# Patient Record
Sex: Male | Born: 1985
Health system: Southern US, Community
[De-identification: ages and names within clinical notes are randomized; demographics above are authoritative.]

## PROBLEM LIST (undated history)

## (undated) DIAGNOSIS — K219 Gastro-esophageal reflux disease without esophagitis: Secondary | ICD-10-CM

## (undated) DIAGNOSIS — F32A Depression, unspecified: Secondary | ICD-10-CM

## (undated) HISTORY — PX: WISDOM TOOTH EXTRACTION: SHX21

## (undated) HISTORY — DX: Depression, unspecified: F32.A

## (undated) HISTORY — DX: Gastro-esophageal reflux disease without esophagitis: K21.9

---
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
## 2007-01-06 ENCOUNTER — Ambulatory Visit: Payer: Self-pay | Admitting: Family Medicine

## 2007-03-25 ENCOUNTER — Ambulatory Visit: Payer: Self-pay | Admitting: Family Medicine

## 2007-03-25 DIAGNOSIS — K0501 Acute gingivitis, non-plaque induced: Secondary | ICD-10-CM

## 2007-03-27 LAB — CONVERTED CEMR LAB
ALT: 65 units/L — ABNORMAL HIGH (ref 0–53)
AST: 38 units/L — ABNORMAL HIGH (ref 0–37)
Albumin: 4.7 g/dL (ref 3.5–5.2)
Basophils Relative: 0.3 % (ref 0.0–1.0)
Chloride: 101 meq/L (ref 96–112)
Eosinophils Absolute: 0.6 10*3/uL (ref 0.0–0.6)
Glucose, Bld: 74 mg/dL (ref 70–99)
Hemoglobin: 18.1 g/dL — ABNORMAL HIGH (ref 13.0–17.0)
Monocytes Absolute: 0.8 10*3/uL — ABNORMAL HIGH (ref 0.2–0.7)
Potassium: 3.6 meq/L (ref 3.5–5.1)
RDW: 11.8 % (ref 11.5–14.6)
Sodium: 146 meq/L — ABNORMAL HIGH (ref 135–145)
Total Bilirubin: 0.5 mg/dL (ref 0.3–1.2)
Total Protein: 7.6 g/dL (ref 6.0–8.3)
VLDL: 16 mg/dL (ref 0–40)
WBC: 9.8 10*3/uL (ref 4.5–10.5)

## 2008-05-07 ENCOUNTER — Ambulatory Visit: Payer: Self-pay | Admitting: Family Medicine

## 2008-08-30 ENCOUNTER — Ambulatory Visit: Payer: Self-pay | Admitting: Family Medicine

## 2008-08-30 DIAGNOSIS — H00019 Hordeolum externum unspecified eye, unspecified eyelid: Secondary | ICD-10-CM

## 2009-02-24 ENCOUNTER — Ambulatory Visit: Payer: Self-pay | Admitting: Family Medicine

## 2009-02-24 DIAGNOSIS — J309 Allergic rhinitis, unspecified: Secondary | ICD-10-CM | POA: Insufficient documentation

## 2009-06-05 ENCOUNTER — Ambulatory Visit: Payer: Self-pay | Admitting: Family Medicine

## 2009-06-05 DIAGNOSIS — R42 Dizziness and giddiness: Secondary | ICD-10-CM

## 2009-06-12 ENCOUNTER — Ambulatory Visit: Payer: Self-pay | Admitting: Family Medicine

## 2009-06-12 DIAGNOSIS — F329 Major depressive disorder, single episode, unspecified: Secondary | ICD-10-CM | POA: Insufficient documentation

## 2009-07-26 ENCOUNTER — Ambulatory Visit: Payer: Self-pay | Admitting: Family Medicine

## 2009-07-26 DIAGNOSIS — J019 Acute sinusitis, unspecified: Secondary | ICD-10-CM | POA: Insufficient documentation

## 2011-01-18 NOTE — Assessment & Plan Note (Signed)
[facility] OFFICE NOTE   NAME:Wayne Patel, Wayne Patel                      MRN:          [id number]  DATE:01/06/2007                            DOB:          02/23/1986    This is a 25 year old gentleman here for a college entrance examination  and generally feels fine and has no complaints at all.  He plans to  begin classes at [employer] this coming August and he  plans to study communications.  He brings with him immunization records  which show that he is up to date, although he is past due for a tetanus  booster.   For details of his past medical history, family history, social history,  habits, etc. I will refer you to our introductory note with him dated  March 07, 1999.   ALLERGIES:  None   MEDICATIONS:  None   OBJECTIVE:  Height 5 foot 4 inches, weight 202, BP 122/72, pulse 76 and  regular.  GENERAL:  He is a little overweight.  SKIN:  Is clear.  EYES:  Are clear. Vision uncorrected is 20/10 on the right, 20/10 on the  left and 20/13 bilaterally. Hearing is grossly normal.  Ears are clear, oropharynx clear.  NECK:  Supple without lymphadenopathy or masses.  LUNGS:  Are clear.  CARDIAC:  Rate and rhythm regular without gallops, murmurs or rubs.  Distal pulses are full.  ABDOMEN:  Soft, normal bowel sounds, nontender, no masses.  GENITALIA:  Normal male.  He is circumcised.  EXTREMITIES:  No clubbing, cyanosis, or edema.  NEUROLOGIC:  Exam is grossly intact.   ASSESSMENT AND PLAN:  Complete physical.  He was given a Tdap booster  today, all of his forms were filled out and he can followup with [place] as  needed.     Tera Mater. Clent Ridges, MD  Electronically Signed    SAF/MedQ  DD: 01/06/2007  DT: 01/06/2007  Job #: [id number]

## 2013-04-22 ENCOUNTER — Telehealth: Payer: Self-pay | Admitting: Family Medicine

## 2013-04-22 NOTE — Telephone Encounter (Signed)
Pt has not been seen in over 3 yrs, mom says you the only MD he's ever known.  Would like to know if you would accept back as a pt.

## 2013-04-23 ENCOUNTER — Encounter: Payer: Self-pay | Admitting: Family Medicine

## 2013-04-23 ENCOUNTER — Ambulatory Visit (INDEPENDENT_AMBULATORY_CARE_PROVIDER_SITE_OTHER): Payer: BC Managed Care – PPO | Admitting: Family Medicine

## 2013-04-23 ENCOUNTER — Ambulatory Visit: Payer: Self-pay | Admitting: Family Medicine

## 2013-04-23 VITALS — BP 110/80 | Temp 98.2°F | Wt 210.0 lb

## 2013-04-23 DIAGNOSIS — S2020XA Contusion of thorax, unspecified, initial encounter: Secondary | ICD-10-CM

## 2013-04-23 DIAGNOSIS — S300XXA Contusion of lower back and pelvis, initial encounter: Secondary | ICD-10-CM

## 2013-04-23 NOTE — Telephone Encounter (Signed)
I had sent a note but I see he is already scheduled.
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
## 2014-11-28 ENCOUNTER — Encounter: Payer: Self-pay | Admitting: Family Medicine

## 2014-11-28 ENCOUNTER — Ambulatory Visit (INDEPENDENT_AMBULATORY_CARE_PROVIDER_SITE_OTHER): Payer: BLUE CROSS/BLUE SHIELD | Admitting: Family Medicine

## 2014-11-28 VITALS — BP 108/82 | HR 76 | Temp 98.3°F | Ht 65.0 in | Wt 216.4 lb

## 2014-11-28 DIAGNOSIS — J029 Acute pharyngitis, unspecified: Secondary | ICD-10-CM | POA: Diagnosis not present

## 2014-11-28 LAB — POCT RAPID STREP A (OFFICE): Rapid Strep A Screen: NEGATIVE

## 2014-11-28 NOTE — Progress Notes (Signed)
   HPI:  Sore Throat: -started: 3 days ago -symptoms:sore throat, cough -denies:fever, SOB, NVD, tooth pain -has tried: none -sick contacts/travel/risks: denies flu exposure, tick exposure or or Ebola risks, no strep exposure to his knowledge -Hx of: strep throat and he is concerned for this  ROS: See pertinent positives and negatives per HPI.  No past medical history on file.  No past surgical history on file.  No family history on file.  History   Social History  . Marital Status: Single    Spouse Name: N/A  . Number of Children: N/A  . Years of Education: N/A   Social History Main Topics  . Smoking status: Never Smoker   . Smokeless tobacco: Not on file  . Alcohol Use: Yes     Comment: occ  . Drug Use: Not on file  . Sexual Activity: Not on file   Other Topics Concern  . None   Social History Narrative    No current outpatient prescriptions on file.  EXAM:  Filed Vitals:   11/28/14 1322  BP: 108/82  Pulse: 76  Temp: 98.3 F (36.8 C)    Body mass index is 36.01 kg/(m^2).  GENERAL: vitals reviewed and listed above, alert, oriented, appears well hydrated and in no acute distress  HEENT: atraumatic, conjunttiva clear, no obvious abnormalities on inspection of external nose and ears, normal appearance of ear canals and TMs, clear nasal congestion, mild post oropharyngeal erythema with PND, 2+ tonsillar edema w/ exudate, no sinus TTP  NECK: no obvious masses on inspection  LUNGS: clear to auscultation bilaterally, no wheezes, rales or rhonchi, good air movement  CV: HRRR, no peripheral edema  MS: moves all extremities without noticeable abnormality  PSYCH: pleasant and cooperative, no obvious depression or anxiety  ASSESSMENT AND PLAN:  Discussed the following assessment and plan:  Acute pharyngitis, unspecified pharyngitis type  -We discussed potential etiologies, with strep or viral pharyngitis or mono being most likely -opted for strep test  given findings with culture and treat if strep -We discussed treatment side effects, likely course, antibiotic misuse, transmission, and signs of developing a serious illness. -of course, we advised to return or notify a doctor immediately if symptoms worsen or persist or new concerns arise.    There are no Patient Instructions on file for this visit.   Kriste BasqueKIM, Oleg Oleson R.

## 2014-11-28 NOTE — Progress Notes (Signed)
Pre visit review using our clinic review tool, if applicable. No additional management support is needed unless otherwise documented below in the visit note. 

## 2014-11-28 NOTE — Patient Instructions (Signed)
BEFORE YOU LEAVE: -rapid strep and culture  Pharyngitis Pharyngitis is redness, pain, and swelling (inflammation) of your pharynx.  CAUSES  Pharyngitis is usually caused by infection. Most of the time, these infections are from viruses (viral) and are part of a cold. However, sometimes pharyngitis is caused by bacteria (bacterial). Pharyngitis can also be caused by allergies. Viral pharyngitis may be spread from person to person by coughing, sneezing, and personal items or utensils (cups, forks, spoons, toothbrushes). Bacterial pharyngitis may be spread from person to person by more intimate contact, such as kissing.  SIGNS AND SYMPTOMS  Symptoms of pharyngitis include:   Sore throat.   Tiredness (fatigue).   Low-grade fever.   Headache.  Joint pain and muscle aches.  Skin rashes.  Swollen lymph nodes.  Plaque-like film on throat or tonsils (often seen with bacterial pharyngitis). DIAGNOSIS  Your health care provider will ask you questions about your illness and your symptoms. Your medical history, along with a physical exam, is often all that is needed to diagnose pharyngitis. Sometimes, a rapid strep test is done. Other lab tests may also be done, depending on the suspected cause.  TREATMENT  Viral pharyngitis will usually get better in 3-4 days without the use of medicine. Bacterial pharyngitis is treated with medicines that kill germs (antibiotics).  HOME CARE INSTRUCTIONS   Drink enough water and fluids to keep your urine clear or pale yellow.   Only take over-the-counter or prescription medicines as directed by your health care provider:   If you are prescribed antibiotics, make sure you finish them even if you start to feel better.   Do not take aspirin.   Get lots of rest.   Gargle with 8 oz of salt water ( tsp of salt per 1 qt of water) as often as every 1-2 hours to soothe your throat.   Throat lozenges (if you are not at risk for choking) or sprays may  be used to soothe your throat. SEEK MEDICAL CARE IF:   You have large, tender lumps in your neck.  You have a rash.  You cough up green, yellow-brown, or bloody spit. SEEK IMMEDIATE MEDICAL CARE IF:   Your neck becomes stiff.  You drool or are unable to swallow liquids.  You vomit or are unable to keep medicines or liquids down.  You have severe pain that does not go away with the use of recommended medicines.  You have trouble breathing (not caused by a stuffy nose). MAKE SURE YOU:   Understand these instructions.  Will watch your condition.  Will get help right away if you are not doing well or get worse. Document Released: 08/19/2005 Document Revised: 06/09/2013 Document Reviewed: 04/26/2013 [facility] Patient Information 2015 [place] [employer], [place]. This information is not intended to replace advice given to you by your health care provider. Make sure you discuss any questions you have with your health care provider.

## 2014-11-29 ENCOUNTER — Telehealth: Payer: Self-pay | Admitting: Family Medicine

## 2014-11-29 NOTE — Telephone Encounter (Signed)
Patient informed and he states he will take Tylenol also if needed and denies any breathing problems, throat closing off or difficulty swallowing.  I also advised him to try the salt water gargles more than once a day as he states he only did this once in the am today.

## 2014-11-29 NOTE — Telephone Encounter (Signed)
Please call pt. No strep on the initially test and there is no treatment for viral or mono related pharyngitis in terms of a cure. Salt water gargles and tylenol or ibuprofen for sore throat. Should seek emergency care if feels throat is close off, can not swallow or breathing difficulty.

## 2014-11-29 NOTE — Telephone Encounter (Signed)
Mom called for pt b/c he has called her several times. [place] so he cannot come in office . But his throat is getting worse . Very sore and pt is concerned it is swelling.  Would like to know if rx could be called in or what to do. Asked mom for pt's mobile no and she gave to me. [phone] [facility] ,[address]/[place], [place]

## 2014-11-30 ENCOUNTER — Ambulatory Visit (INDEPENDENT_AMBULATORY_CARE_PROVIDER_SITE_OTHER): Payer: BLUE CROSS/BLUE SHIELD | Admitting: Internal Medicine

## 2014-11-30 ENCOUNTER — Encounter: Payer: Self-pay | Admitting: Internal Medicine

## 2014-11-30 ENCOUNTER — Telehealth: Payer: Self-pay | Admitting: Family Medicine

## 2014-11-30 VITALS — BP 110/96 | HR 92 | Temp 98.3°F | Ht 65.0 in | Wt 214.8 lb

## 2014-11-30 DIAGNOSIS — J039 Acute tonsillitis, unspecified: Secondary | ICD-10-CM | POA: Diagnosis not present

## 2014-11-30 LAB — CULTURE, GROUP A STREP: Organism ID, Bacteria: NORMAL

## 2014-11-30 MED ORDER — PREDNISONE 20 MG PO TABS: 20.0000 mg | ORAL_TABLET | Freq: Two times a day (BID) | ORAL | Status: DC

## 2014-11-30 MED ORDER — CEPHALEXIN 500 MG PO CAPS: 500.0000 mg | ORAL_CAPSULE | Freq: Two times a day (BID) | ORAL | Status: DC

## 2014-11-30 NOTE — Patient Instructions (Signed)
Zicam Melts or Zinc lozenges as per package label for sore throat .    Stay on clear liquids for 48-72 hours or until throat improves.This would include  jello, sherbert (NOT ice cream), Lipton's chicken noodle soup(NOT cream based soups),Gatorade Lite, flat Ginger ale (without High Fructose Corn Syrup),dry toast or crackers, baked potato.No milk , dairy or grease .

## 2014-11-30 NOTE — Progress Notes (Signed)
Pre visit review using our clinic review tool, if applicable. No additional management support is needed unless otherwise documented below in the visit note. 

## 2014-11-30 NOTE — Progress Notes (Signed)
   Subjective:    Patient ID: Wayne Patel, male    DOB: 12/22/1985, 29 y.o.   MRN: [id number]  HPI  His symptoms started 11/25/14 as sore throat and swelling on the right. As of 3/28 this involves the left throat as well. He was seen 3/28; strep swab was negative. He was told to use saline gargles and Tylenol.  The throat symptoms have progressed. He has a cough which he feels is simply a reflex in an attempt to clear his throat. He has no other extrinsic or upper respiratory tract infection symptoms.  Review of Systems Frontal headache, facial pain , nasal purulence, dental pain, sore throat , otic pain or otic discharge denied. No fever , chills or sweats.Extrinsic symptoms of itchy, watery eyes, sneezing, or angioedema are denied. There is no significant cough, sputum production, wheezing,or  paroxysmal nocturnal dyspnea.    Objective:   Physical Exam  General appearance:Adequately nourished; no acute distress or increased work of breathing is present.   BMI:  Lymphatic: No  lymphadenopathy about the head, neck, or axilla .  Eyes: No conjunctival inflammation or lid edema is present. There is no scleral icterus.EOMI w/o pain  Ears:  External ear exam shows no significant lesions or deformities.  Otoscopic examination reveals clear canals, tympanic membranes are intact bilaterally without bulging, retraction, inflammation or discharge.  Nose:  External nasal examination shows no deformity or inflammation. Nasal mucosa minimally erythematous without lesions or exudates No septal dislocation or deviation.No obstruction to airflow.   Oral exam: Dental hygiene is good; lips and gums are healthy appearing.His tonsils are 2.5+ enlarged There is severe erythema with punctate exudate .  Neck:  No deformities, thyromegaly, masses, or tenderness noted.   Supple with full range of motion without pain.   Heart:  Normal rate and regular rhythm. S1 and S2 normal without gallop, murmur,  click, rub or other extra sounds.   Lungs:Chest clear to auscultation; no wheezes, rhonchi,rales ,or rubs present.  Extremities:  No cyanosis, edema, or clubbing  noted    Skin: Warm & dry w/o tenting or jaundice. No significant lesions or rash.       Assessment & Plan:   #1 tonsillitis  Plan: See orders and recommendations

## 2014-12-05 NOTE — Telephone Encounter (Signed)
done

## 2015-05-01 ENCOUNTER — Ambulatory Visit (INDEPENDENT_AMBULATORY_CARE_PROVIDER_SITE_OTHER): Payer: BLUE CROSS/BLUE SHIELD | Admitting: Family Medicine

## 2015-05-01 ENCOUNTER — Encounter: Payer: Self-pay | Admitting: Family Medicine

## 2015-05-01 VITALS — BP 110/80 | HR 78 | Temp 98.2°F | Ht 65.0 in | Wt 211.0 lb

## 2015-05-01 DIAGNOSIS — J039 Acute tonsillitis, unspecified: Secondary | ICD-10-CM | POA: Diagnosis not present

## 2015-05-01 MED ORDER — CEPHALEXIN 500 MG PO CAPS: 500.0000 mg | ORAL_CAPSULE | Freq: Three times a day (TID) | ORAL | Status: DC

## 2015-05-01 NOTE — Progress Notes (Signed)
Pre visit review using our clinic review tool, if applicable. No additional management support is needed unless otherwise documented below in the visit note. 

## 2015-05-01 NOTE — Progress Notes (Signed)
   Subjective:    Patient ID: Wayne Patel, male    DOB: 20-May-1986, 29 y.o.   MRN: [id number]  HPI Here for swollen tonsils. He was seen for a similar bout in March when he had swollen and painfu;l tonsils. A throat culture then was negative. He responded to a course of Keflex and prednisone. He felt back to normal until a week ago when he again developed swollen tonsils that are mildly painful. No other URI sx, no fever.   Review of Systems  Constitutional: Negative.   HENT: Positive for sore throat. Negative for congestion, postnasal drip, sinus pressure, trouble swallowing and voice change.   Eyes: Negative.   Respiratory: Negative.        Objective:   Physical Exam  Constitutional: He appears well-developed and well-nourished.  HENT:  Right Ear: External ear normal.  Left Ear: External ear normal.  Nose: Nose normal.  Both tonsils are red and swollen, no exudate   Eyes: Conjunctivae are normal.  Neck: No thyromegaly present.  Pulmonary/Chest: Effort normal and breath sounds normal.  Lymphadenopathy:    He has no cervical adenopathy.          Assessment & Plan:  Recurrent tonsillitis, treat with Keflex. Use Advil prn.

## 2018-10-04 DIAGNOSIS — B349 Viral infection, unspecified: Secondary | ICD-10-CM | POA: Diagnosis not present

## 2020-01-08 ENCOUNTER — Emergency Department
Admission: EM | Admit: 2020-01-08 | Discharge: 2020-01-08 | Disposition: A | Discharge status: Home / Self Care | Payer: BC Managed Care – PPO | Attending: Emergency Medicine | Admitting: Emergency Medicine

## 2020-01-08 ENCOUNTER — Other Ambulatory Visit: Payer: Self-pay

## 2020-01-08 ENCOUNTER — Encounter: Payer: Self-pay | Admitting: Emergency Medicine

## 2020-01-08 DIAGNOSIS — R0789 Other chest pain: Secondary | ICD-10-CM | POA: Insufficient documentation

## 2020-01-08 DIAGNOSIS — Z5321 Procedure and treatment not carried out due to patient leaving prior to being seen by health care provider: Secondary | ICD-10-CM | POA: Diagnosis not present

## 2020-01-08 LAB — CBC
HCT: 53.6 % — ABNORMAL HIGH (ref 39.0–52.0)
Hemoglobin: 18.3 g/dL — ABNORMAL HIGH (ref 13.0–17.0)
MCH: 29.6 pg (ref 26.0–34.0)
MCHC: 34.1 g/dL (ref 30.0–36.0)
MCV: 86.6 fL (ref 80.0–100.0)
Platelets: 290 10*3/uL (ref 150–400)
RBC: 6.19 MIL/uL — ABNORMAL HIGH (ref 4.22–5.81)
RDW: 12.5 % (ref 11.5–15.5)
WBC: 12.3 10*3/uL — ABNORMAL HIGH (ref 4.0–10.5)
nRBC: 0 % (ref 0.0–0.2)

## 2020-01-08 LAB — BASIC METABOLIC PANEL
Anion gap: 9 (ref 5–15)
BUN: 12 mg/dL (ref 6–20)
CO2: 25 mmol/L (ref 22–32)
Calcium: 9.6 mg/dL (ref 8.9–10.3)
Chloride: 107 mmol/L (ref 98–111)
Creatinine, Ser: 1.01 mg/dL (ref 0.61–1.24)
GFR calc Af Amer: >60 mL/min (ref >60)
GFR calc non Af Amer: >60 mL/min (ref >60)
Glucose, Bld: 100 mg/dL — ABNORMAL HIGH (ref 70–99)
Potassium: 3.8 mmol/L (ref 3.5–5.1)
Sodium: 141 mmol/L (ref 135–145)

## 2020-01-08 LAB — TROPONIN I (HIGH SENSITIVITY): Troponin I (High Sensitivity): 3 ng/L (ref <18)

## 2020-01-08 MED ORDER — SODIUM CHLORIDE 0.9% FLUSH: 3.0000 mL | Freq: Once | INTRAVENOUS | Status: DC

## 2020-01-08 NOTE — ED Notes (Signed)
First Nurse Note: Pt to ED via POV c/o chest pain. Pt is in NAD.  

## 2020-01-08 NOTE — ED Triage Notes (Signed)
Pt presents to ED via POV with c/o generalized CP that started yesterday. Pt states initially started with "feeling off for the last few days". Pt states yesterday started having chest pressure, states feels like it radiates from L to R side. Pt states family hx of early MI. Pt A&O x4, ambulatory to triage at this time.

## 2020-01-10 ENCOUNTER — Telehealth: Payer: Self-pay | Admitting: Emergency Medicine

## 2020-01-10 ENCOUNTER — Telehealth: Payer: Self-pay | Admitting: Family Medicine

## 2020-01-10 NOTE — Telephone Encounter (Signed)
Pt experience pressure on chest this weekend and would like to speak to a nurse. Per pt, he's okay today but has a little of anxiety. Thanks

## 2020-01-10 NOTE — Telephone Encounter (Signed)
error 

## 2020-01-10 NOTE — Telephone Encounter (Signed)
Called patient due to lwot to inquire about condition and follow up plans. Says he has called his pcp and will follow up.

## 2020-01-11 ENCOUNTER — Other Ambulatory Visit: Payer: Self-pay

## 2020-01-11 NOTE — Telephone Encounter (Signed)
Pt will need an appointment to discuss this with Dr. Clent Ridges.  Marcello Fennel

## 2020-01-12 ENCOUNTER — Encounter: Payer: Self-pay | Admitting: Family Medicine

## 2020-01-12 ENCOUNTER — Ambulatory Visit (INDEPENDENT_AMBULATORY_CARE_PROVIDER_SITE_OTHER): Payer: BC Managed Care – PPO | Admitting: Family Medicine

## 2020-01-12 VITALS — BP 132/72 | HR 83 | Temp 98.3°F | Wt 240.4 lb

## 2020-01-12 DIAGNOSIS — R079 Chest pain, unspecified: Secondary | ICD-10-CM

## 2020-01-12 NOTE — Progress Notes (Signed)
   Subjective:    Patient ID: Wayne Patel, male    DOB: 06-Jan-1986, 34 y.o.   MRN: [id number]  HPI Here to re-establish and to follow up an ER visit on 01-08-20. That day he devloped a pressure sensation in the chest that made him worry about his heart. No SOB or nasuea or sweats. He has heartburn occasionally but he had never felt this pressure before. At the ER he left before a provider could see him, but he had labs drawn and an EKG done while he was there. The labs, including a troponin, were all normal and the EKG showed only sinus tachycardia. After he went home the chest pressure resolved and he has felt fine ever since. He admits to gaining a lot of weight in the past year. His diet is terrible and he has been drinking a lot of soda.   Review of Systems  Constitutional: Negative.   Respiratory: Negative.   Cardiovascular: Positive for chest pain. Negative for palpitations and leg swelling.  Gastrointestinal: Negative.        Objective:   Physical Exam Constitutional:      Appearance: He is obese. He is not ill-appearing.  Cardiovascular:     Rate and Rhythm: Normal rate and regular rhythm.     Pulses: Normal pulses.     Heart sounds: Normal heart sounds.  Pulmonary:     Effort: Pulmonary effort is normal.     Breath sounds: Normal breath sounds.  Neurological:     Mental Status: He is alert.           Assessment & Plan:  His episode of chest pressure was almost certainly esophageal in origin. We talked about changing his diet and losing weight. Recheck prn. Gershon Crane, MD

## 2020-07-25 ENCOUNTER — Telehealth (INDEPENDENT_AMBULATORY_CARE_PROVIDER_SITE_OTHER): Payer: Self-pay | Admitting: Family Medicine

## 2020-07-25 ENCOUNTER — Encounter: Payer: Self-pay | Admitting: Family Medicine

## 2020-07-25 VITALS — Temp 97.6°F | Wt 240.0 lb

## 2020-07-25 DIAGNOSIS — J0391 Acute recurrent tonsillitis, unspecified: Secondary | ICD-10-CM

## 2020-07-25 MED ORDER — CEPHALEXIN 500 MG PO CAPS: 500.0000 mg | ORAL_CAPSULE | Freq: Three times a day (TID) | ORAL | 0 refills | Status: AC

## 2020-07-25 NOTE — Progress Notes (Signed)
   Subjective:    Patient ID: Wayne Patel, male    DOB: 05/09/1986, 34 y.o.   MRN: [id number]  HPI Virtual Visit via Video Note  I connected with the patient on 07/25/20 at  3:30 PM EST by a video enabled telemedicine application and verified that I am speaking with the correct person using two identifiers.  Location patient: home Location provider:work or home office Persons participating in the virtual visit: patient, provider  I discussed the limitations of evaluation and management by telemedicine and the availability of in person appointments. The patient expressed understanding and agreed to proceed.   HPI: Here for 3 days of a ST and swollen lymph nodes in the neck. No fever or cough or headache. No loss of taste or smell. No body aches or NVD. Drinking fluids and taking Ibuprofen. Every 2-3 years he has recurrent bouts of tonsillitis, and he asks about possibly having them removed.    ROS: See pertinent positives and negatives per HPI.  History reviewed. No pertinent past medical history.  History reviewed. No pertinent surgical history.  History reviewed. No pertinent family history.   Current Outpatient Medications:  .  cephALEXin (KEFLEX) 500 MG capsule, Take 1 capsule (500 mg total) by mouth 3 (three) times daily for 10 days., Disp: 30 capsule, Rfl: 0  EXAM:  VITALS per patient if applicable:  GENERAL: alert, oriented, appears well and in no acute distress  HEENT: atraumatic, conjunttiva clear, no obvious abnormalities on inspection of external nose and ears  NECK: normal movements of the head and neck  LUNGS: on inspection no signs of respiratory distress, breathing rate appears normal, no obvious gross SOB, gasping or wheezing  CV: no obvious cyanosis  MS: moves all visible extremities without noticeable abnormality  PSYCH/NEURO: pleasant and cooperative, no obvious depression or anxiety, speech and thought processing grossly intact  ASSESSMENT AND  PLAN: Recurrent tonsillitis, treat with 10 days of Keflex. Refer to ENT to consider a possible tonsillectomy. Gershon Crane, MD  Discussed the following assessment and plan:  No diagnosis found.     I discussed the assessment and treatment plan with the patient. The patient was provided an opportunity to ask questions and all were answered. The patient agreed with the plan and demonstrated an understanding of the instructions.   The patient was advised to call back or seek an in-person evaluation if the symptoms worsen or if the condition fails to improve as anticipated.      Review of Systems     Objective:   Physical Exam        Assessment & Plan:

## 2021-04-09 ENCOUNTER — Encounter: Payer: Self-pay | Admitting: Family Medicine

## 2021-04-09 ENCOUNTER — Other Ambulatory Visit: Payer: Self-pay

## 2021-04-09 ENCOUNTER — Ambulatory Visit (INDEPENDENT_AMBULATORY_CARE_PROVIDER_SITE_OTHER): Payer: BC Managed Care – PPO | Admitting: Family Medicine

## 2021-04-09 VITALS — BP 108/78 | HR 80 | Temp 98.8°F | Wt 243.0 lb

## 2021-04-09 DIAGNOSIS — K219 Gastro-esophageal reflux disease without esophagitis: Secondary | ICD-10-CM | POA: Diagnosis not present

## 2021-04-09 DIAGNOSIS — Z0000 Encounter for general adult medical examination without abnormal findings: Secondary | ICD-10-CM

## 2021-04-09 LAB — CBC WITH DIFFERENTIAL/PLATELET
Basophils Absolute: 0.1 10*3/uL (ref 0.0–0.1)
Basophils Relative: 0.9 % (ref 0.0–3.0)
Eosinophils Absolute: 0.5 10*3/uL (ref 0.0–0.7)
Eosinophils Relative: 5.2 % — ABNORMAL HIGH (ref 0.0–5.0)
HCT: 52 % (ref 39.0–52.0)
Hemoglobin: 17.9 g/dL — ABNORMAL HIGH (ref 13.0–17.0)
Lymphocytes Relative: 29.4 % (ref 12.0–46.0)
Lymphs Abs: 2.9 10*3/uL (ref 0.7–4.0)
MCHC: 34.4 g/dL (ref 30.0–36.0)
MCV: 87.7 fl (ref 78.0–100.0)
Monocytes Absolute: 0.6 10*3/uL (ref 0.1–1.0)
Monocytes Relative: 6.7 % (ref 3.0–12.0)
Neutro Abs: 5.6 10*3/uL (ref 1.4–7.7)
Neutrophils Relative %: 57.8 % (ref 43.0–77.0)
Platelets: 264 10*3/uL (ref 150.0–400.0)
RBC: 5.93 Mil/uL — ABNORMAL HIGH (ref 4.22–5.81)
RDW: 13.2 % (ref 11.5–15.5)
WBC: 9.7 10*3/uL (ref 4.0–10.5)

## 2021-04-09 LAB — LIPID PANEL
Cholesterol: 140 mg/dL (ref 0–200)
HDL: 34.3 mg/dL — ABNORMAL LOW (ref >39.00)
LDL Cholesterol: 83 mg/dL (ref 0–99)
NonHDL: 105.96
Total CHOL/HDL Ratio: 4
Triglycerides: 117 mg/dL (ref 0.0–149.0)
VLDL: 23.4 mg/dL (ref 0.0–40.0)

## 2021-04-09 LAB — BASIC METABOLIC PANEL
BUN: 15 mg/dL (ref 6–23)
CO2: 27 mEq/L (ref 19–32)
Calcium: 10 mg/dL (ref 8.4–10.5)
Chloride: 102 mEq/L (ref 96–112)
Creatinine, Ser: 1.07 mg/dL (ref 0.40–1.50)
GFR: 91.26 mL/min (ref >60.00)
Glucose, Bld: 84 mg/dL (ref 70–99)
Potassium: 4.4 mEq/L (ref 3.5–5.1)
Sodium: 139 mEq/L (ref 135–145)

## 2021-04-09 LAB — HEPATIC FUNCTION PANEL
ALT: 65 U/L — ABNORMAL HIGH (ref 0–53)
AST: 32 U/L (ref 0–37)
Albumin: 4.7 g/dL (ref 3.5–5.2)
Alkaline Phosphatase: 83 U/L (ref 39–117)
Bilirubin, Direct: 0.1 mg/dL (ref 0.0–0.3)
Total Bilirubin: 0.5 mg/dL (ref 0.2–1.2)
Total Protein: 7.3 g/dL (ref 6.0–8.3)

## 2021-04-09 LAB — HEMOGLOBIN A1C: Hgb A1c MFr Bld: 5 % (ref 4.6–6.5)

## 2021-04-09 LAB — T3, FREE: T3, Free: 4 pg/mL (ref 2.3–4.2)

## 2021-04-09 LAB — T4, FREE: Free T4: 0.96 ng/dL (ref 0.60–1.60)

## 2021-04-09 LAB — TSH: TSH: 2.1 u[IU]/mL (ref 0.35–5.50)

## 2021-04-09 MED ORDER — OMEPRAZOLE 40 MG PO CPDR
40.0000 mg | DELAYED_RELEASE_CAPSULE | Freq: Every day | ORAL | 3 refills | Status: DC
Start: 2021-04-09 — End: 2021-07-16

## 2021-04-09 NOTE — Progress Notes (Signed)
Subjective:    Patient ID: Wayne Patel, male    DOB: 07-16-1986, 35 y.o.   MRN: [id number]  HPI Here for a well exam. His only complaint is frequent heartburn. Wayne Patel has changed his diet by reducing his consumption of fried and spicy foods, and he has beem able to ose some weight. However he still has heartburn from time to time. He has tried OTC Omeprazole , which helps, but he still has breakthrough episodes.    Review of Systems  Constitutional: Negative.   HENT: Negative.    Eyes: Negative.   Respiratory: Negative.    Cardiovascular: Negative.   Gastrointestinal: Negative.   Genitourinary: Negative.   Musculoskeletal: Negative.   Skin: Negative.   Neurological: Negative.   Psychiatric/Behavioral: Negative.        Objective:   Physical Exam Constitutional:      General: He is not in acute distress.    Appearance: Normal appearance. He is well-developed. He is not diaphoretic.  HENT:     Head: Normocephalic and atraumatic.     Right Ear: External ear normal.     Left Ear: External ear normal.     Nose: Nose normal.     Mouth/Throat:     Pharynx: No oropharyngeal exudate.  Eyes:     General: No scleral icterus.       Right eye: No discharge.        Left eye: No discharge.     Conjunctiva/sclera: Conjunctivae normal.     Pupils: Pupils are equal, round, and reactive to light.  Neck:     Thyroid: No thyromegaly.     Vascular: No JVD.     Trachea: No tracheal deviation.  Cardiovascular:     Rate and Rhythm: Normal rate and regular rhythm.     Heart sounds: Normal heart sounds. No murmur heard.   No friction rub. No gallop.  Pulmonary:     Effort: Pulmonary effort is normal. No respiratory distress.     Breath sounds: Normal breath sounds. No wheezing or rales.  Chest:     Chest wall: No tenderness.  Abdominal:     General: Bowel sounds are normal. There is no distension.     Palpations: Abdomen is soft. There is no mass.     Tenderness: There is no  abdominal tenderness. There is no guarding or rebound.  Genitourinary:    Penis: Normal. No tenderness.      Testes: Normal.  Musculoskeletal:        General: No tenderness. Normal range of motion.     Cervical back: Neck supple.  Lymphadenopathy:     Cervical: No cervical adenopathy.  Skin:    General: Skin is warm and dry.     Coloration: Skin is not pale.     Findings: No erythema or rash.  Neurological:     Mental Status: He is alert and oriented to person, place, and time.     Cranial Nerves: No cranial nerve deficit.     Motor: No abnormal muscle tone.     Coordination: Coordination normal.     Deep Tendon Reflexes: Reflexes are normal and symmetric. Reflexes normal.  Psychiatric:        Behavior: Behavior normal.        Thought Content: Thought content normal.        Judgment: Judgment normal.          Assessment & Plan:  Well exam. We discussed diet and exercise. Get  fasting labs. For the GERD he will try Omeprazole 40 mg daily.  Gershon Crane, MD

## 2021-07-16 ENCOUNTER — Ambulatory Visit (INDEPENDENT_AMBULATORY_CARE_PROVIDER_SITE_OTHER): Payer: BC Managed Care – PPO | Admitting: Family Medicine

## 2021-07-16 ENCOUNTER — Encounter: Payer: Self-pay | Admitting: Family Medicine

## 2021-07-16 VITALS — BP 120/80 | HR 75 | Temp 98.7°F | Wt 244.0 lb

## 2021-07-16 DIAGNOSIS — K219 Gastro-esophageal reflux disease without esophagitis: Secondary | ICD-10-CM | POA: Diagnosis not present

## 2021-07-16 MED ORDER — FAMOTIDINE 40 MG PO TABS: 40.0000 mg | ORAL_TABLET | Freq: Every day | ORAL | 3 refills | Status: DC

## 2021-07-16 MED ORDER — OMEPRAZOLE 40 MG PO CPDR: 40.0000 mg | DELAYED_RELEASE_CAPSULE | Freq: Every day | ORAL | 3 refills | Status: DC

## 2021-07-16 NOTE — Progress Notes (Signed)
   Subjective:    Patient ID: Wayne Patel, male    DOB: 1986/08/09, 35 y.o.   MRN: [id number]  HPI Here to follow up on GERD. He takes Omeprazole every morning, and this works well until late afteroon. Then he often has heatburn in the evenings. It does not disturb his sleep. No trouble swallowing.    Review of Systems  Constitutional: Negative.   Respiratory: Negative.    Cardiovascular: Negative.   Gastrointestinal: Negative.       Objective:   Physical Exam Constitutional:      Appearance: Normal appearance.  Cardiovascular:     Rate and Rhythm: Normal rate and regular rhythm.     Pulses: Normal pulses.     Heart sounds: Normal heart sounds.  Pulmonary:     Effort: Pulmonary effort is normal.     Breath sounds: Normal breath sounds.  Abdominal:     General: Abdomen is flat. Bowel sounds are normal. There is no distension.     Palpations: Abdomen is soft. There is no mass.     Tenderness: There is no abdominal tenderness. There is no guarding or rebound.     Hernia: No hernia is present.  Neurological:     Mental Status: He is alert.          Assessment & Plan:  GERD. We will add Pepcid 40 mg each afternoon to the Omeprazole. Recheck as needed. Gershon Crane, MD

## 2021-08-07 ENCOUNTER — Encounter: Payer: Self-pay | Admitting: Family Medicine

## 2021-08-07 ENCOUNTER — Ambulatory Visit (INDEPENDENT_AMBULATORY_CARE_PROVIDER_SITE_OTHER): Payer: BC Managed Care – PPO | Admitting: Family Medicine

## 2021-08-07 VITALS — BP 118/80 | HR 90 | Temp 98.8°F | Wt 245.0 lb

## 2021-08-07 DIAGNOSIS — K219 Gastro-esophageal reflux disease without esophagitis: Secondary | ICD-10-CM | POA: Diagnosis not present

## 2021-08-07 DIAGNOSIS — Z23 Encounter for immunization: Secondary | ICD-10-CM | POA: Diagnosis not present

## 2021-08-07 NOTE — Addendum Note (Signed)
Addended by: Carola Rhine on: 08/07/2021 05:41 PM   Modules accepted: Orders

## 2021-08-07 NOTE — Progress Notes (Signed)
   Subjective:    Patient ID: Wayne Patel, male    DOB: 1986-04-26, 35 y.o.   MRN: [id number]  HPI Here to follow up on GERD. He has been taking Omeprazole every morning and Famotidine every evening, but this has not helped much. He still gets frequent heartburn no matter what he eats. No trouble swallowing.    Review of Systems  Constitutional: Negative.   Respiratory: Negative.    Cardiovascular: Negative.   Gastrointestinal:  Negative for abdominal distention, blood in stool, constipation, diarrhea and nausea.      Objective:   Physical Exam Constitutional:      Appearance: Normal appearance.  Cardiovascular:     Rate and Rhythm: Normal rate and regular rhythm.     Pulses: Normal pulses.     Heart sounds: Normal heart sounds.  Pulmonary:     Effort: Pulmonary effort is normal.     Breath sounds: Normal breath sounds.  Abdominal:     General: Abdomen is flat. Bowel sounds are normal. There is no distension.     Palpations: Abdomen is soft. There is no mass.     Tenderness: There is no abdominal tenderness. There is no guarding or rebound.     Hernia: No hernia is present.  Neurological:     Mental Status: He is alert.          Assessment & Plan:  GERD, refer to GI.  Gershon Crane, MD

## 2021-08-15 ENCOUNTER — Encounter: Payer: Self-pay | Admitting: Internal Medicine

## 2021-08-20 ENCOUNTER — Encounter: Payer: Self-pay | Admitting: *Deleted

## 2021-09-04 ENCOUNTER — Ambulatory Visit: Payer: BC Managed Care – PPO | Admitting: Internal Medicine

## 2021-09-17 ENCOUNTER — Ambulatory Visit (INDEPENDENT_AMBULATORY_CARE_PROVIDER_SITE_OTHER): Payer: BC Managed Care – PPO | Admitting: Nurse Practitioner

## 2021-09-17 ENCOUNTER — Encounter: Payer: Self-pay | Admitting: Nurse Practitioner

## 2021-09-17 VITALS — BP 120/84 | HR 76 | Ht 64.5 in | Wt 248.1 lb

## 2021-09-17 DIAGNOSIS — R7989 Other specified abnormal findings of blood chemistry: Secondary | ICD-10-CM | POA: Diagnosis not present

## 2021-09-17 DIAGNOSIS — K219 Gastro-esophageal reflux disease without esophagitis: Secondary | ICD-10-CM

## 2021-09-17 MED ORDER — PANTOPRAZOLE SODIUM 40 MG PO TBEC: 40.0000 mg | DELAYED_RELEASE_TABLET | Freq: Every day | ORAL | 1 refills | Status: DC

## 2021-09-17 NOTE — Patient Instructions (Addendum)
It was great seeing you today! Thank you for entrusting me with your care and choosing [facility].  Arnaldo Natal, CRNP  PROCEDURES: You have been scheduled for a EGD. Please follow the written instructions given to you at your visit today. If you use inhalers (even only as needed), please bring them with you on the day of your procedure.  IMAGING: You will be contacted by [facility] Scheduling (Your caller ID will indicate phone # [phone]) in the next 7 days to schedule your Abdominal Ultrasound. If you have not heard from them within 7 business days, please call [facility] Scheduling at [phone] to follow up on the status of your appointment.    RECOMMENDATIONS: Stop Omeprazole Start Pantoprazole 40 MG once a day. This has been sent to your pharmacy. You may also take Famotidine 40 MG at bedtime. See GERD information/diet.  Gastroesophageal Reflux Disease, Adult Gastroesophageal reflux (GER) happens when acid from the stomach flows up into the tube that connects the mouth and the stomach (esophagus). Normally, food travels down the esophagus and stays in the stomach to be digested. With GER, food and stomach acid sometimes move back up into the esophagus. You may have a disease called gastroesophageal reflux disease (GERD) if the reflux: Happens often. Causes frequent or very bad symptoms. Causes problems such as damage to the esophagus. When this happens, the esophagus becomes sore and swollen. Over time, GERD can make small holes (ulcers) in the lining of the esophagus. What are the causes? This condition is caused by a problem with the muscle between the esophagus and the stomach. When this muscle is weak or not normal, it does not close properly to keep food and acid from coming back up from the stomach. The muscle can be weak because of: Tobacco use. Pregnancy. Having a certain type of hernia (hiatal hernia). Alcohol use. Certain  foods and drinks, such as coffee, chocolate, onions, and peppermint. What increases the risk? Being overweight. Having a disease that affects your connective tissue. Taking NSAIDs, such a ibuprofen. What are the signs or symptoms? Heartburn. Difficult or painful swallowing. The feeling of having a lump in the throat. A bitter taste in the mouth. Bad breath. Having a lot of saliva. Having an upset or bloated stomach. Burping. Chest pain. Different conditions can cause chest pain. Make sure you see your doctor if you have chest pain. Shortness of breath or wheezing. A long-term cough or a cough at night. Wearing away of the surface of teeth (tooth enamel). Weight loss. How is this treated? Making changes to your diet. Taking medicine. Having surgery. Treatment will depend on how bad your symptoms are. Follow these instructions at home: Eating and drinking  Follow a diet as told by your doctor. You may need to avoid foods and drinks such as: Coffee and tea, with or without caffeine. Drinks that contain alcohol. Energy drinks and sports drinks. Bubbly (carbonated) drinks or sodas. Chocolate and cocoa. Peppermint and mint flavorings. Garlic and onions. Horseradish. Spicy and acidic foods. These include peppers, chili powder, curry powder, vinegar, hot sauces, and BBQ sauce. Citrus fruit juices and citrus fruits, such as oranges, lemons, and limes. Tomato-based foods. These include red sauce, chili, salsa, and pizza with red sauce. Fried and fatty foods. These include donuts, french fries, potato chips, and high-fat dressings. High-fat meats. These include hot dogs, rib eye steak, sausage, ham, and bacon. High-fat dairy items, such as whole milk, butter, and cream cheese.  Eat small meals often. Avoid eating large meals. Avoid drinking large amounts of liquid with your meals. Avoid eating meals during the 2-3 hours before bedtime. Avoid lying down right after you eat. Do not  exercise right after you eat. Lifestyle  Do not smoke or use any products that contain nicotine or tobacco. If you need help quitting, ask your doctor. Try to lower your stress. If you need help doing this, ask your doctor. If you are overweight, lose an amount of weight that is healthy for you. Ask your doctor about a safe weight loss goal. General instructions Pay attention to any changes in your symptoms. Take over-the-counter and prescription medicines only as told by your doctor. Do not take aspirin, ibuprofen, or other NSAIDs unless your doctor says it is okay. Wear loose clothes. Do not wear anything tight around your waist. Raise (elevate) the head of your bed about 6 inches (15 cm). You may need to use a wedge to do this. Avoid bending over if this makes your symptoms worse. Keep all follow-up visits. Contact a doctor if: You have new symptoms. You lose weight and you do not know why. You have trouble swallowing or it hurts to swallow. You have wheezing or a cough that keeps happening. You have a hoarse voice. Your symptoms do not get better with treatment. Get help right away if: You have sudden pain in your arms, neck, jaw, teeth, or back. You suddenly feel sweaty, dizzy, or light-headed. You have chest pain or shortness of breath. You vomit and the vomit is green, yellow, or black, or it looks like blood or coffee grounds. You faint. Your poop (stool) is red, bloody, or black. You cannot swallow, drink, or eat. These symptoms may represent a serious problem that is an emergency. Do not wait to see if the symptoms will go away. Get medical help right away. Call your local emergency services (911 in the [place] Do not drive yourself to the hospital. Summary If a person has gastroesophageal reflux disease (GERD), food and stomach acid move back up into the esophagus and cause symptoms or problems such as damage to the esophagus. Treatment will depend on how bad your symptoms  are. Follow a diet as told by your doctor. Take all medicines only as told by your doctor. This information is not intended to replace advice given to you by your health care provider. Make sure you discuss any questions you have with your health care provider. Document Revised: 02/28/2020 Document Reviewed: 02/28/2020 Elsevier Patient Education  2022 Elsevier Inc.  The [facility] providers would like to encourage you to use [facility] to communicate with providers for non-urgent requests or questions.  Due to long hold times on the telephone, sending your provider a message by [facility] may be faster and more efficient way to get a response. Please allow 48 business hours for a response.  Please remember that this is for non-urgent requests/questions. If you are age 78 or older, your body mass index should be between 23-30. Your Body mass index is 41.93 kg/m. If this is out of the aforementioned range listed, please consider follow up with your Primary Care Provider.  If you are age 75 or younger, your body mass index should be between 19-25. Your Body mass index is 41.93 kg/m. If this is out of the aformentioned range listed, please consider follow up with your Primary Care Provider.

## 2021-09-17 NOTE — Progress Notes (Signed)
Agree with the assessment and plan as outlined by Colleen Kennedy-Smith, NP.    Brantlee Penn E. Terri Rorrer, MD [facility]  

## 2021-09-17 NOTE — Progress Notes (Signed)
09/17/2021 Wayne Patel [id number] 07/31/1986   CHIEF COMPLAINT: GERD  HISTORY OF PRESENT ILLNESS: [name]. Wayne Patel is a 36 year old male with a past medical history of elevated LFTs and GERD. He presents to our office today as referred by Dr. Alysia Penna for further evaluation regarding GERD.  He developed GERD symptoms about 5 to 6 months ago.  He was seen by Dr. Sarajane Jews 04/09/2021 due to having worsening reflux symptoms.  He was prescribed Omeprazole 40 mg daily.  His heartburn slightly improved but did not abate and Famotidine 40 mg was added nightly.  He reported eating more spicy foods and drinking more sodas during the [place] pandemic.  He was seen by Dr. Sarajane Jews 08/07/2021 with persistent heartburn symptoms and esophageal discomfort.  He describes feeling as if something is in his esophagus.  He is able to swallow liquids and solid foods without any difficulty.  He sometimes tries to burp to alleviate his discomfort.  No specific food triggers.  He can eat pizza without any esophageal discomfort then the next day eats a salad and has heartburn.  No nausea or vomiting.  No upper or lower abdominal pain.  He is passing normal formed brown bowel movements daily.  No rectal bleeding or black stools.  Infrequent NSAID use.  He was seen in the ED due to having CP (chest pressure) 01/2020, a 12 lead EKG and troponin levels were normal.  He was evaluated by Dr. Sarajane Jews following his ED evaluation 01/12/2020 and his chest pain was assessed to be esophageal in origin with no further concerns regarding cardiac etiology.  CBC Latest Ref Rng & Units 04/09/2021 01/08/2020 03/25/2007  WBC 4.0 - 10.5 K/uL 9.7 12.3(H) 9.8  Hemoglobin 13.0 - 17.0 g/dL 17.9(H) 18.3(H) 18.1(H)  Hematocrit 39.0 - 52.0 % 52.0 53.6(H) 53.9(H)  Platelets 150.0 - 400.0 K/uL 264.0 290 252    CMP Latest Ref Rng & Units 04/09/2021 01/08/2020 03/25/2007  Glucose 70 - 99 mg/dL 84 100(H) 74  BUN 6 - 23 mg/dL 15 12 12   Creatinine 0.40 - 1.50 mg/dL  1.07 1.01 1.2  Sodium 135 - 145 mEq/L 139 141 146(H)  Potassium 3.5 - 5.1 mEq/L 4.4 3.8 3.6  Chloride 96 - 112 mEq/L 102 107 101  CO2 19 - 32 mEq/L 27 25 34(H)  Calcium 8.4 - 10.5 mg/dL 10.0 9.6 10.2  Total Protein 6.0 - 8.3 g/dL 7.3 - 7.6  Total Bilirubin 0.2 - 1.2 mg/dL 0.5 - 0.5  Alkaline Phos 39 - 117 U/L 83 - 75  AST 0 - 37 U/L 32 - 38(H)  ALT 0 - 53 U/L 65(H) - 65(H)    Social History: He is single.  He works in Therapist, art.  Non-smoker.  He drinks one beer once or twice monthly. No drug use.   Family History: No known family history of esophageal, gastric or colon cancer.  No Known Allergies   Outpatient Encounter Medications as of 09/17/2021  Medication Sig   famotidine (PEPCID) 40 MG tablet Take 1 tablet (40 mg total) by mouth daily.   omeprazole (PRILOSEC) 40 MG capsule Take 1 capsule (40 mg total) by mouth daily.   No facility-administered encounter medications on file as of 09/17/2021.   REVIEW OF SYSTEMS:  Gen: Denies fever, sweats or chills. No weight loss.  CV: No chest pain at this time, Resp: Denies cough, shortness of breath of hemoptysis.  GI: See HPI. GU : Denies urinary burning, blood  in urine, increased urinary frequency or incontinence. MS: Denies joint pain, muscles aches or weakness. Derm: Denies rash, itchiness, skin lesions or unhealing ulcers. Psych: History of depression during his college years. Heme: Denies bruising, bleeding. Neuro:  Denies headaches, dizziness or paresthesias. Endo:  Denies any problems with DM, thyroid or adrenal function.  PHYSICAL EXAM: BP 120/84 (BP Location: Left Arm, Patient Position: Sitting, Cuff Size: Normal)    Pulse 76    Ht 5' 4.5" (1.638 m) Comment: height measured without shoes   Wt 248 lb 2 oz (112.5 kg)    BMI 41.93 kg/m   General: 36 year old male in no acute distress. Head: Normocephalic and atraumatic. Eyes:  Sclerae non-icteric, conjunctive pink. Ears: Normal auditory acuity. Mouth: Dentition  intact. No ulcers or lesions.  Neck: Supple, no lymphadenopathy or thyromegaly.  Lungs: Clear bilaterally to auscultation without wheezes, crackles or rhonchi. Heart: Regular rate and rhythm. No murmur, rub or gallop appreciated.  Abdomen: Soft, nontender, non distended. No masses. No hepatosplenomegaly. Normoactive bowel sounds x 4 quadrants.  Rectal: Deferred. Musculoskeletal: Symmetrical with no gross deformities. Skin: Warm and dry. No rash or lesions on visible extremities. Extremities: No edema. Neurological: Alert oriented x 4, no focal deficits.  Psychological:  Alert and cooperative. Normal mood and affect.  ASSESSMENT AND PLAN:  14) 36 year old male with GERD symptoms, feels like something is inside his esophagus. No dysphagia. No abdominal pain.  He wishes to proceed with an EGD in order to obtain a definitive diagnosis as he has tried a PPI, H2 blocker and GERD diet with persistent symptoms. -EGD benefits and risks discussed including risk with sedation, risk of bleeding, perforation and infection  -GERD handout -Stop Omeprazole.  Start Pantoprazole 40 mg daily.  May continue famotidine 40 mg nightly.  2) Elevated LFTs. BMI 41.9. -RUQ sonogram to rule out gallstones and hepatic steatosis -Hepatic panel  -If repeat hepatic panel remains elevated further hepatic serologies will be ordered  Further recommendations to be determined after the above evaluation completed    CC:  Laurey Morale, MD

## 2021-09-26 ENCOUNTER — Encounter: Payer: Self-pay | Admitting: Family Medicine

## 2021-09-26 ENCOUNTER — Ambulatory Visit (HOSPITAL_COMMUNITY)
Admission: RE | Admit: 2021-09-26 | Discharge: 2021-09-26 | Disposition: A | Discharge status: Home / Self Care | Payer: BC Managed Care – PPO | Source: Ambulatory Visit | Attending: Nurse Practitioner | Admitting: Nurse Practitioner

## 2021-09-26 ENCOUNTER — Other Ambulatory Visit: Payer: Self-pay

## 2021-09-26 DIAGNOSIS — R7989 Other specified abnormal findings of blood chemistry: Secondary | ICD-10-CM | POA: Diagnosis not present

## 2021-09-26 DIAGNOSIS — K219 Gastro-esophageal reflux disease without esophagitis: Secondary | ICD-10-CM | POA: Insufficient documentation

## 2021-09-26 DIAGNOSIS — R932 Abnormal findings on diagnostic imaging of liver and biliary tract: Secondary | ICD-10-CM | POA: Diagnosis not present

## 2021-09-26 DIAGNOSIS — R945 Abnormal results of liver function studies: Secondary | ICD-10-CM | POA: Diagnosis not present

## 2021-09-27 NOTE — Telephone Encounter (Signed)
This shows increased fatty tissue in the liver (called "fatty liver") which can irritate the liver and cause the blood levels of liver enzymes to go up. This is treated with reducing fat in the diet and losing weight

## 2021-10-03 ENCOUNTER — Other Ambulatory Visit: Payer: Self-pay

## 2021-10-03 DIAGNOSIS — K769 Liver disease, unspecified: Secondary | ICD-10-CM

## 2021-10-05 ENCOUNTER — Telehealth: Payer: Self-pay

## 2021-10-05 NOTE — Telephone Encounter (Signed)
Received a call from the patient's insurance [employer]. They are changing the imaging location for the patient's MRI and are requesting an order be faxed. It is to [facility] in [place] fax [phone]. I have asked that the patient be advised this will delay [place] in receiving the imaging results. Insurance Okey Regal agrees to make the patient aware. Order printed and faxed.

## 2021-10-09 NOTE — Telephone Encounter (Signed)
[employer] called stating that [facility] in [place], fax [phone] never received patient's order for the MRI of the Abdomen, order #[id number].  Can you please refax the order?  Thank you.

## 2021-10-09 NOTE — Telephone Encounter (Signed)
I have fax confirmations from today and Friday 10/05/21. Fax number confirmed by the website. I have spoken with the patient.

## 2021-10-11 ENCOUNTER — Other Ambulatory Visit: Payer: Self-pay

## 2021-10-11 DIAGNOSIS — K769 Liver disease, unspecified: Secondary | ICD-10-CM

## 2021-10-11 NOTE — Telephone Encounter (Signed)
Our referral coordinator will get the PA done. I did not realize that insurance had not approved it since they were changing the location. Tried to call the patient to explain this. No answer. No voicemail. Message to our referral coordinator to make her aware.

## 2021-10-11 NOTE — Telephone Encounter (Signed)
Patient called said the Imaging center needs his insurance information.

## 2021-10-12 ENCOUNTER — Telehealth: Payer: Self-pay | Admitting: Nurse Practitioner

## 2021-10-12 ENCOUNTER — Ambulatory Visit (HOSPITAL_COMMUNITY): Payer: BC Managed Care – PPO

## 2021-10-12 NOTE — Telephone Encounter (Signed)
Faxed the order form with the stamped and printed signature of  Alcide Evener

## 2021-10-12 NOTE — Telephone Encounter (Signed)
Received a call from Freeman at [facility] regarding patient's recent order.  The order does not have a signature (or electronic signature) and, therefore, she is unable to process it. Can you please fax a signed order to her?  The fax number is [phone].  Thank you.

## 2021-10-19 DIAGNOSIS — K760 Fatty (change of) liver, not elsewhere classified: Secondary | ICD-10-CM | POA: Diagnosis not present

## 2021-11-02 ENCOUNTER — Encounter: Payer: Self-pay | Admitting: Gastroenterology

## 2021-11-02 ENCOUNTER — Other Ambulatory Visit: Payer: Self-pay

## 2021-11-02 ENCOUNTER — Ambulatory Visit (AMBULATORY_SURGERY_CENTER): Payer: BC Managed Care – PPO | Admitting: Gastroenterology

## 2021-11-02 VITALS — BP 119/78 | HR 73 | Temp 97.7°F | Resp 15 | Ht 64.0 in | Wt 248.0 lb

## 2021-11-02 DIAGNOSIS — K222 Esophageal obstruction: Secondary | ICD-10-CM

## 2021-11-02 DIAGNOSIS — R7989 Other specified abnormal findings of blood chemistry: Secondary | ICD-10-CM

## 2021-11-02 DIAGNOSIS — K449 Diaphragmatic hernia without obstruction or gangrene: Secondary | ICD-10-CM | POA: Diagnosis not present

## 2021-11-02 DIAGNOSIS — K219 Gastro-esophageal reflux disease without esophagitis: Secondary | ICD-10-CM | POA: Diagnosis not present

## 2021-11-02 HISTORY — PX: ESOPHAGOGASTRODUODENOSCOPY: SHX1529

## 2021-11-02 MED ORDER — SODIUM CHLORIDE 0.9 % IV SOLN: 500.0000 mL | Freq: Once | INTRAVENOUS | Status: DC

## 2021-11-02 NOTE — Progress Notes (Signed)
Report to PACU, RN, vss, BBS= Clear.  

## 2021-11-02 NOTE — Progress Notes (Signed)
[facility] History and Physical ? ? ?Primary Care Physician:  Nelwyn Salisbury, MD ? ? ?Reason for Procedure:   GERD symptoms not responsive to PPI ? ?Plan:    EGD ? ? ? ? ?HPI: Wayne Patel is a 36 y.o. male undergoing EGD to evaluate chronic GERD symptoms which are still persistent despite omeprazole and famotidine.  Burning pain is better, but he continues to have episodic chest tightness and pressure.  No dysphagia. ? ? ?Past Medical History:  ?Diagnosis Date  ? Depression   ? GERD (gastroesophageal reflux disease)   ? ? ?Past Surgical History:  ?Procedure Laterality Date  ? WISDOM TOOTH EXTRACTION    ? ? ?Prior to Admission medications   ?Medication Sig Start Date End Date Taking? Authorizing Provider  ?famotidine (PEPCID) 40 MG tablet Take 1 tablet (40 mg total) by mouth daily. ?Patient taking differently: Take 40 mg by mouth every evening. 07/16/21  Yes Nelwyn Salisbury, MD  ?pantoprazole (PROTONIX) 40 MG tablet Take 1 tablet (40 mg total) by mouth daily. Take 30 minutes before breakfast. 09/17/21   Arnaldo Natal, NP  ? ? ?Current Outpatient Medications  ?Medication Sig Dispense Refill  ? famotidine (PEPCID) 40 MG tablet Take 1 tablet (40 mg total) by mouth daily. (Patient taking differently: Take 40 mg by mouth every evening.) 90 tablet 3  ? pantoprazole (PROTONIX) 40 MG tablet Take 1 tablet (40 mg total) by mouth daily. Take 30 minutes before breakfast. 30 tablet 1  ? ?Current Facility-Administered Medications  ?Medication Dose Route Frequency Provider Last Rate Last Admin  ? 0.9 %  sodium chloride infusion  500 mL Intravenous Once Jenel Lucks, MD      ? ? ?Allergies as of 11/02/2021  ? (No Known Allergies)  ? ? ?Family History  ?Problem Relation Age of Onset  ? Heart attack Father   ? Hypertension Father   ? Hyperlipidemia Father   ? Heart disease Paternal Grandfather   ? Cancer Paternal Grandfather   ?     Oral  ? Skin cancer Cousin   ? ? ?Social History  ? ?Socioeconomic  History  ? Marital status: Single  ?  Spouse name: Not on file  ? Number of children: 0  ? Years of education: Not on file  ? Highest education level: Bachelor's degree (e.g., BA, AB, BS)  ?Occupational History  ? Occupation: Clinical biochemist  ?Tobacco Use  ? Smoking status: Never  ? Smokeless tobacco: Never  ?Vaping Use  ? Vaping Use: Never used  ?Substance and Sexual Activity  ? Alcohol use: Yes  ?  Comment: occ-social  ? Drug use: Never  ? Sexual activity: Not on file  ?Other Topics Concern  ? Not on file  ?Social History Narrative  ? Not on file  ? ?Social Determinants of Health  ? ?Financial Resource Strain: Low Risk   ? Difficulty of Paying Living Expenses: Not hard at all  ?Food Insecurity: No Food Insecurity  ? Worried About Programme researcher, broadcasting/film/video in the Last Year: Never true  ? Ran Out of Food in the Last Year: Never true  ?Transportation Needs: No Transportation Needs  ? Lack of Transportation (Medical): No  ? Lack of Transportation (Non-Medical): No  ?Physical Activity: Insufficiently Active  ? Days of Exercise per Week: 2 days  ? Minutes of Exercise per Session: 30 min  ?Stress: No Stress Concern Present  ? Feeling of Stress : Only a little  ?Social Connections:  Moderately Isolated  ? Frequency of Communication with Friends and Family: Three times a week  ? Frequency of Social Gatherings with Friends and Family: Once a week  ? Attends Religious Services: 1 to 4 times per year  ? Active Member of Clubs or Organizations: No  ? Attends Banker Meetings: Not on file  ? Marital Status: Never married  ?Intimate Partner Violence: Not on file  ? ? ?Review of Systems: ? ?All other review of systems negative except as mentioned in the HPI. ? ?Physical Exam: ?Vital signs ?BP 140/74   Pulse 77   Temp 97.7 ?F (36.5 ?C)   Ht 5\' 4"  (1.626 m)   Wt 248 lb (112.5 kg)   SpO2 95%   BMI 42.57 kg/m?  ? ?General:   Alert,  Well-developed, well-nourished, pleasant and cooperative in NAD ?Airway:  Mallampati  2 ?Lungs:  Clear throughout to auscultation.   ?Heart:  Regular rate and rhythm; no murmurs, clicks, rubs,  or gallops. ?Abdomen:  Soft, nontender and nondistended. Normal bowel sounds.   ?Neuro/Psych:  Normal mood and affect. A and O x 3 ? ? ?Audianna Landgren E. , MD ?[facility] ? ?

## 2021-11-02 NOTE — Patient Instructions (Signed)
Read all of the handouts given to you by your recovery room nurse.   ? ?YOU HAD AN ENDOSCOPIC PROCEDURE TODAY AT THE [facility]:   Refer to the procedure report that was given to you for any specific questions about what was found during the examination.  If the procedure report does not answer your questions, please call your gastroenterologist to clarify.  If you requested that your care partner not be given the details of your procedure findings, then the procedure report has been included in a sealed envelope for you to review at your convenience later. ? ?YOU SHOULD EXPECT: Some feelings of bloating in the abdomen. Passage of more gas than usual.  Walking can help get rid of the air that was put into your GI tract during the procedure and reduce the bloating.  ? ?Please Note:  You might notice some irritation and congestion in your nose or some drainage.  This is from the oxygen used during your procedure.  There is no need for concern and it should clear up in a day or so. ? ?SYMPTOMS TO REPORT IMMEDIATELY: ? ? ?Following upper endoscopy (EGD) ? Vomiting of blood or coffee ground material ? New chest pain or pain under the shoulder blades ? Painful or persistently difficult swallowing ? New shortness of breath ? Fever of 100?F or higher ? Black, tarry-looking stools ? ?For urgent or emergent issues, a gastroenterologist can be reached at any hour by calling [phone]. ?Do not use MyChart messaging for urgent concerns.  ? ? ?DIET:  We do recommend a small meal at first, but then you may proceed to your regular diet.  Drink plenty of fluids but you should avoid alcoholic beverages for 24 hours. ?Avoid acidic foods and soda. ? ?ACTIVITY:  You should plan to take it easy for the rest of today and you should NOT DRIVE or use heavy machinery until tomorrow (because of the sedation medicines used during the test).   ? ?FOLLOW UP: ?Our staff will call the number listed on your records 48-72 hours  following your procedure to check on you and address any questions or concerns that you may have regarding the information given to you following your procedure. If we do not reach you, we will leave a message.  We will attempt to reach you two times.  During this call, we will ask if you have developed any symptoms of COVID 19. If you develop any symptoms (ie: fever, flu-like symptoms, shortness of breath, cough etc.) before then, please call [phone].  If you test positive for Covid 19 in the 2 weeks post procedure, please call and report this information to [place].   ? ? ?SIGNATURES/CONFIDENTIALITY: ?You and/or your care partner have signed paperwork which will be entered into your electronic medical record.  These signatures attest to the fact that that the information above on your After Visit Summary has been reviewed and is understood.  Full responsibility of the confidentiality of this discharge information lies with you and/or your care-partner.  ?

## 2021-11-02 NOTE — Op Note (Signed)
[facility] ?Patient Name: Wayne Patel ?Procedure Date: 11/02/2021 10:36 AM ?MRN: [id number] ?Endoscopist: Brek Reece E. Candis Schatz , MD ?Age: 36 ?Referring MD:  ?Date of Birth: 1986-01-23 ?Gender: Male ?Account #: [id number] ?Procedure:                Upper GI endoscopy ?Indications:              Esophageal reflux symptoms that persist despite  ?                          appropriate therapy ?Medicines:                Monitored Anesthesia Care ?Procedure:                Pre-Anesthesia Assessment: ?                          - Prior to the procedure, a History and Physical  ?                          was performed, and patient medications and  ?                          allergies were reviewed. The patient's tolerance of  ?                          previous anesthesia was also reviewed. The risks  ?                          and benefits of the procedure and the sedation  ?                          options and risks were discussed with the patient.  ?                          All questions were answered, and informed consent  ?                          was obtained. Prior Anticoagulants: The patient has  ?                          taken no previous anticoagulant or antiplatelet  ?                          agents. ASA Grade Assessment: III - A patient with  ?                          severe systemic disease. After reviewing the risks  ?                          and benefits, the patient was deemed in  ?                          satisfactory condition to undergo the procedure. ?  After obtaining informed consent, the endoscope was  ?                          passed under direct vision. Throughout the  ?                          procedure, the patient's blood pressure, pulse, and  ?                          oxygen saturations were monitored continuously. The  ?                          Endoscope was introduced through the mouth, and  ?                          advanced to the second part of  duodenum. The upper  ?                          GI endoscopy was accomplished without difficulty.  ?                          The patient tolerated the procedure well. ?Scope In: ?Scope Out: ?Findings:                 The examined portions of the nasopharynx,  ?                          oropharynx and larynx were normal. ?                          The Z-line was irregular. ?                          A widely patent Schatzki ring was found at the  ?                          gastroesophageal junction. ?                          The examined esophagus was normal. ?                          A 3 cm hiatal hernia was present. ?                          The entire examined stomach was normal. ?                          The examined duodenum was normal. ?Complications:            No immediate complications. ?Estimated Blood Loss:     Estimated blood loss: none. ?Impression:               - The examined portions of the nasopharynx,  ?                          oropharynx  and larynx were normal. ?                          - Z-line irregular. ?                          - Widely patent Schatzki ring. ?                          - Normal esophagus. ?                          - 3 cm hiatal hernia. ?                          - Normal stomach. ?                          - Normal examined duodenum. ?                          - No specimens collected. ?Recommendation:           - Patient has a contact number available for  ?                          emergencies. The signs and symptoms of potential  ?                          delayed complications were discussed with the  ?                          patient. Return to normal activities tomorrow.  ?                          Written discharge instructions were provided to the  ?                          patient. ?                          - Resume previous diet. ?                          - Continue present medications. ?                          - Consider pH/impedance/manometry testing  for  ?                          further evaluation of GERD symptoms despite PPI  ?                          therapy. ?Shakeerah Gradel E. Candis Schatz, MD ?11/02/2021 10:57:01 AM ?This report has been signed electronically. ?

## 2021-11-05 ENCOUNTER — Telehealth: Payer: Self-pay | Admitting: Nurse Practitioner

## 2021-11-05 NOTE — Telephone Encounter (Signed)
Ammie, pls inform patient his abdominal MRI dated 10/19/2021 showed a liver cyst which is a benign finding and hepatic steatosis (fatty liver).  I recommend for him to eat a low carb diet, exercise as tolerated, lose 10 lbs to reduce the risk of developing fatty liver disease. Pls send him to our lab to have a hepatic panel done this week. DX: Elevated ALT level. Hepatic steatosis. Thx  ?

## 2021-11-05 NOTE — Telephone Encounter (Signed)
Called pt and informed of results and recommendations as reviewed and documented by Carl Best, NP. LVM requesting returned call. ?

## 2021-11-06 ENCOUNTER — Telehealth: Payer: Self-pay

## 2021-11-06 NOTE — Telephone Encounter (Signed)
Called pt and informed of results and recommendations as reviewed and documented by Carl Best, NP. Verbalized acceptance and understanding. Orders placed for repeat labs. Reminder created to ensure pt has completed labs in a timely manner. ? ?

## 2021-11-06 NOTE — Telephone Encounter (Signed)
?  Follow up Call- ? ?Call back number 11/02/2021  ?Post procedure Call Back phone  # [phone]  ?Permission to leave phone message Yes  ?Some recent data might be hidden  ?  ? ?Patient questions: ? ?Do you have a fever, pain , or abdominal swelling? No. ?Pain Score  0 * ? ?Have you tolerated food without any problems? Yes.   ? ?Have you been able to return to your normal activities? Yes.   ? ?Do you have any questions about your discharge instructions: ?Diet   No. ?Medications  No. ?Follow up visit  No. ? ?Do you have questions or concerns about your Care? No. ? ?Actions: ?* If pain score is 4 or above: ?No action needed, pain <4. ? ? ?

## 2021-11-08 NOTE — Telephone Encounter (Signed)
Called pt and informed of Colleen Kennedy-Smith, NP's response below. Expressed appreciation, acceptance and understanding. ?

## 2021-11-08 NOTE — Telephone Encounter (Signed)
Wayne Patel, the patient can complete the labs in the next week or two is fine.  Thank you for the follow-up. ?

## 2021-11-08 NOTE — Telephone Encounter (Signed)
Review of pt chart indicates pt has not yet completed labs. Called pt to provide courtesy reminder to complete in a timely manner per Alcide Evener, NP's recommendations. Pt states he is not going to be able to complete this week and does not want to complete until next week. Routing this message to Alcide Evener, NP for her to review and address. Advised I will call him with her response to ensure there isn't anything she is seeing that would interfere with his request to delay. Verbalized acceptance and understanding. ?

## 2021-11-12 ENCOUNTER — Other Ambulatory Visit (INDEPENDENT_AMBULATORY_CARE_PROVIDER_SITE_OTHER): Payer: BC Managed Care – PPO

## 2021-11-12 DIAGNOSIS — R7989 Other specified abnormal findings of blood chemistry: Secondary | ICD-10-CM

## 2021-11-12 LAB — HEPATIC FUNCTION PANEL
ALT: 45 U/L (ref 0–53)
AST: 26 U/L (ref 0–37)
Albumin: 4.5 g/dL (ref 3.5–5.2)
Alkaline Phosphatase: 70 U/L (ref 39–117)
Bilirubin, Direct: 0.1 mg/dL (ref 0.0–0.3)
Total Bilirubin: 0.5 mg/dL (ref 0.2–1.2)
Total Protein: 7 g/dL (ref 6.0–8.3)

## 2021-11-19 ENCOUNTER — Other Ambulatory Visit: Payer: Self-pay

## 2021-11-19 DIAGNOSIS — R7989 Other specified abnormal findings of blood chemistry: Secondary | ICD-10-CM

## 2021-11-19 DIAGNOSIS — K219 Gastro-esophageal reflux disease without esophagitis: Secondary | ICD-10-CM

## 2021-11-19 MED ORDER — PANTOPRAZOLE SODIUM 40 MG PO TBEC: 40.0000 mg | DELAYED_RELEASE_TABLET | Freq: Every day | ORAL | 2 refills | Status: DC

## 2022-03-01 ENCOUNTER — Other Ambulatory Visit: Payer: Self-pay | Admitting: Nurse Practitioner

## 2022-03-01 DIAGNOSIS — K219 Gastro-esophageal reflux disease without esophagitis: Secondary | ICD-10-CM

## 2022-03-01 MED ORDER — PANTOPRAZOLE SODIUM 40 MG PO TBEC: 40.0000 mg | DELAYED_RELEASE_TABLET | Freq: Every day | ORAL | 2 refills | Status: DC

## 2022-07-24 ENCOUNTER — Encounter: Payer: BC Managed Care – PPO | Admitting: Family Medicine

## 2022-07-30 ENCOUNTER — Ambulatory Visit (INDEPENDENT_AMBULATORY_CARE_PROVIDER_SITE_OTHER): Payer: BC Managed Care – PPO | Admitting: Family Medicine

## 2022-07-30 ENCOUNTER — Encounter: Payer: Self-pay | Admitting: Family Medicine

## 2022-07-30 VITALS — BP 118/80 | HR 80 | Temp 98.7°F | Ht 65.0 in | Wt 244.2 lb

## 2022-07-30 DIAGNOSIS — Z0000 Encounter for general adult medical examination without abnormal findings: Secondary | ICD-10-CM

## 2022-07-30 LAB — CBC WITH DIFFERENTIAL/PLATELET
Basophils Absolute: 0.1 10*3/uL (ref 0.0–0.1)
Basophils Relative: 0.6 % (ref 0.0–3.0)
Eosinophils Absolute: 0.4 10*3/uL (ref 0.0–0.7)
Eosinophils Relative: 3.2 % (ref 0.0–5.0)
HCT: 53.3 % — ABNORMAL HIGH (ref 39.0–52.0)
Hemoglobin: 17.7 g/dL — ABNORMAL HIGH (ref 13.0–17.0)
Lymphocytes Relative: 29.2 % (ref 12.0–46.0)
Lymphs Abs: 3.6 10*3/uL (ref 0.7–4.0)
MCHC: 33.2 g/dL (ref 30.0–36.0)
MCV: 85.4 fl (ref 78.0–100.0)
Monocytes Absolute: 0.8 10*3/uL (ref 0.1–1.0)
Monocytes Relative: 6.5 % (ref 3.0–12.0)
Neutro Abs: 7.5 10*3/uL (ref 1.4–7.7)
Neutrophils Relative %: 60.5 % (ref 43.0–77.0)
Platelets: 307 10*3/uL (ref 150.0–400.0)
RBC: 6.24 Mil/uL — ABNORMAL HIGH (ref 4.22–5.81)
RDW: 13.3 % (ref 11.5–15.5)
WBC: 12.4 10*3/uL — ABNORMAL HIGH (ref 4.0–10.5)

## 2022-07-30 LAB — BASIC METABOLIC PANEL
BUN: 15 mg/dL (ref 6–23)
CO2: 27 mEq/L (ref 19–32)
Calcium: 9.5 mg/dL (ref 8.4–10.5)
Chloride: 103 mEq/L (ref 96–112)
Creatinine, Ser: 0.95 mg/dL (ref 0.40–1.50)
GFR: 104.3 mL/min (ref >60.00)
Glucose, Bld: 85 mg/dL (ref 70–99)
Potassium: 4 mEq/L (ref 3.5–5.1)
Sodium: 140 mEq/L (ref 135–145)

## 2022-07-30 LAB — LIPID PANEL
Cholesterol: 132 mg/dL (ref 0–200)
HDL: 38.3 mg/dL — ABNORMAL LOW (ref >39.00)
LDL Cholesterol: 69 mg/dL (ref 0–99)
NonHDL: 93.74
Total CHOL/HDL Ratio: 3
Triglycerides: 122 mg/dL (ref 0.0–149.0)
VLDL: 24.4 mg/dL (ref 0.0–40.0)

## 2022-07-30 LAB — HEPATIC FUNCTION PANEL
ALT: 42 U/L (ref 0–53)
AST: 30 U/L (ref 0–37)
Albumin: 4.6 g/dL (ref 3.5–5.2)
Alkaline Phosphatase: 79 U/L (ref 39–117)
Bilirubin, Direct: 0.1 mg/dL (ref 0.0–0.3)
Total Bilirubin: 0.5 mg/dL (ref 0.2–1.2)
Total Protein: 7.6 g/dL (ref 6.0–8.3)

## 2022-07-30 LAB — TSH: TSH: 1.5 u[IU]/mL (ref 0.35–5.50)

## 2022-07-30 LAB — HEMOGLOBIN A1C: Hgb A1c MFr Bld: 5.2 % (ref 4.6–6.5)

## 2022-07-30 NOTE — Progress Notes (Signed)
   Subjective:    Patient ID: Wayne Patel, male    DOB: Sep 17, 1985, 36 y.o.   MRN: [id number]  HPI Here for a well exam. He is doing well. He had an upper endoscopy in March which demonstrated a small hiatal hernia. No doubt this contributes to his GERD. However he does well with his current medications .   Review of Systems  Constitutional: Negative.   HENT: Negative.    Eyes: Negative.   Respiratory: Negative.    Cardiovascular: Negative.   Gastrointestinal: Negative.   Genitourinary: Negative.   Musculoskeletal: Negative.   Skin: Negative.   Neurological: Negative.   Psychiatric/Behavioral: Negative.         Objective:   Physical Exam Constitutional:      General: He is not in acute distress.    Appearance: Normal appearance. He is well-developed. He is not diaphoretic.  HENT:     Head: Normocephalic and atraumatic.     Right Ear: External ear normal.     Left Ear: External ear normal.     Nose: Nose normal.     Mouth/Throat:     Pharynx: No oropharyngeal exudate.  Eyes:     General: No scleral icterus.       Right eye: No discharge.        Left eye: No discharge.     Conjunctiva/sclera: Conjunctivae normal.     Pupils: Pupils are equal, round, and reactive to light.  Neck:     Thyroid: No thyromegaly.     Vascular: No JVD.     Trachea: No tracheal deviation.  Cardiovascular:     Rate and Rhythm: Normal rate and regular rhythm.     Heart sounds: Normal heart sounds. No murmur heard.    No friction rub. No gallop.  Pulmonary:     Effort: Pulmonary effort is normal. No respiratory distress.     Breath sounds: Normal breath sounds. No wheezing or rales.  Chest:     Chest wall: No tenderness.  Abdominal:     General: Bowel sounds are normal. There is no distension.     Palpations: Abdomen is soft. There is no mass.     Tenderness: There is no abdominal tenderness. There is no guarding or rebound.  Genitourinary:    Penis: Normal. No tenderness.       Testes: Normal.  Musculoskeletal:        General: No tenderness. Normal range of motion.     Cervical back: Neck supple.  Lymphadenopathy:     Cervical: No cervical adenopathy.  Skin:    General: Skin is warm and dry.     Coloration: Skin is not pale.     Findings: No erythema or rash.  Neurological:     Mental Status: He is alert and oriented to person, place, and time.     Cranial Nerves: No cranial nerve deficit.     Motor: No abnormal muscle tone.     Coordination: Coordination normal.     Deep Tendon Reflexes: Reflexes are normal and symmetric. Reflexes normal.  Psychiatric:        Behavior: Behavior normal.        Thought Content: Thought content normal.        Judgment: Judgment normal.           Assessment & Plan:  Well exam. We discussed diet and exercise. Get fasting labs. Gershon Crane, MD

## 2022-10-06 ENCOUNTER — Encounter: Payer: Self-pay | Admitting: Family Medicine

## 2022-10-07 ENCOUNTER — Telehealth (INDEPENDENT_AMBULATORY_CARE_PROVIDER_SITE_OTHER): Payer: BC Managed Care – PPO | Admitting: Family Medicine

## 2022-10-07 ENCOUNTER — Encounter: Payer: Self-pay | Admitting: Family Medicine

## 2022-10-07 DIAGNOSIS — U071 COVID-19: Secondary | ICD-10-CM

## 2022-10-07 MED ORDER — NIRMATRELVIR/RITONAVIR (PAXLOVID)TABLET: 3.0000 | ORAL_TABLET | Freq: Two times a day (BID) | ORAL | 0 refills | Status: AC

## 2022-10-07 NOTE — Telephone Encounter (Signed)
He was seen in our office today

## 2022-10-07 NOTE — Progress Notes (Signed)
   Subjective:    Patient ID: Wayne Patel, male    DOB: 22-Nov-1985, 37 y.o.   MRN: [id number]  HPI Virtual Visit via Video Note  I connected with the patient on 10/07/22 at  8:45 AM EST by a video enabled telemedicine application and verified that I am speaking with the correct person using two identifiers.  Location patient: home Location provider:work or home office Persons participating in the virtual visit: patient, provider  I discussed the limitations of evaluation and management by telemedicine and the availability of in person appointments. The patient expressed understanding and agreed to proceed.   HPI: Here for 5 days of stuffy head, PND, and a ST. No fever or cough or SOB. He tested positive for the Covid virus yesterday.   ROS: See pertinent positives and negatives per HPI.  Past Medical History:  Diagnosis Date   Depression    GERD (gastroesophageal reflux disease)     Past Surgical History:  Procedure Laterality Date   ESOPHAGOGASTRODUODENOSCOPY  11/02/2021   per Dr. Candis Schatz, 3 cm hiatal hernia, otherwise clear   WISDOM TOOTH EXTRACTION      Family History  Problem Relation Age of Onset   Heart attack Father    Hypertension Father    Hyperlipidemia Father    Heart disease Paternal Grandfather    Cancer Paternal Grandfather        Oral   Skin cancer Cousin      Current Outpatient Medications:    famotidine (PEPCID) 40 MG tablet, Take 1 tablet (40 mg total) by mouth daily. (Patient taking differently: Take 40 mg by mouth every evening.), Disp: 90 tablet, Rfl: 3   nirmatrelvir/ritonavir (PAXLOVID) 20 x 150 MG & 10 x 100MG  TABS, Take 3 tablets by mouth 2 (two) times daily for 5 days. (Take nirmatrelvir 150 mg two tablets twice daily for 5 days and ritonavir 100 mg one tablet twice daily for 5 days) Patient GFR is 104, Disp: 30 tablet, Rfl: 0   pantoprazole (PROTONIX) 40 MG tablet, Take 1 tablet (40 mg total) by mouth daily. Take 30 minutes before  breakfast., Disp: 90 tablet, Rfl: 2  EXAM:  VITALS per patient if applicable:  GENERAL: alert, oriented, appears well and in no acute distress  HEENT: atraumatic, conjunttiva clear, no obvious abnormalities on inspection of external nose and ears  NECK: normal movements of the head and neck  LUNGS: on inspection no signs of respiratory distress, breathing rate appears normal, no obvious gross SOB, gasping or wheezing  CV: no obvious cyanosis  MS: moves all visible extremities without noticeable abnormality  PSYCH/NEURO: pleasant and cooperative, no obvious depression or anxiety, speech and thought processing grossly intact  ASSESSMENT AND PLAN: Covid infection, treat with 5 days of Paxlovid. Follow up as needed.  Alysia Penna, MD  Discussed the following assessment and plan:  No diagnosis found.     I discussed the assessment and treatment plan with the patient. The patient was provided an opportunity to ask questions and all were answered. The patient agreed with the plan and demonstrated an understanding of the instructions.   The patient was advised to call back or seek an in-person evaluation if the symptoms worsen or if the condition fails to improve as anticipated.      Review of Systems     Objective:   Physical Exam        Assessment & Plan:

## 2023-01-01 ENCOUNTER — Other Ambulatory Visit: Payer: Self-pay | Admitting: Nurse Practitioner

## 2023-01-01 DIAGNOSIS — K219 Gastro-esophageal reflux disease without esophagitis: Secondary | ICD-10-CM

## 2023-01-01 NOTE — Telephone Encounter (Signed)
Contacted pt & LVM to return call 

## 2023-03-10 ENCOUNTER — Ambulatory Visit: Payer: BC Managed Care – PPO | Admitting: Nurse Practitioner

## 2023-05-02 ENCOUNTER — Encounter: Payer: Self-pay | Admitting: Family Medicine

## 2023-05-02 ENCOUNTER — Ambulatory Visit (INDEPENDENT_AMBULATORY_CARE_PROVIDER_SITE_OTHER): Payer: BC Managed Care – PPO | Admitting: Family Medicine

## 2023-05-02 VITALS — BP 124/86 | HR 71 | Temp 98.2°F | Wt 241.0 lb

## 2023-05-02 DIAGNOSIS — R030 Elevated blood-pressure reading, without diagnosis of hypertension: Secondary | ICD-10-CM | POA: Diagnosis not present

## 2023-05-02 NOTE — Progress Notes (Signed)
   Subjective:    Patient ID: Wayne Patel, male    DOB: 10-18-1985, 37 y.o.   MRN: [id number]  HPI Here with concerns about his BP. 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 He has checked his Bp at work a few times, and this has beenin the 130's over 90's. He feels fine physically. He asks for advice.    Review of Systems  Constitutional: Negative.   Respiratory: Negative.    Cardiovascular: Negative.   Neurological: Negative.        Objective:   Physical Exam Constitutional:      Appearance: Normal appearance.  Cardiovascular:     Rate and Rhythm: Normal rate and regular rhythm.     Pulses: Normal pulses.     Heart sounds: Normal heart sounds.  Pulmonary:     Effort: Pulmonary effort is normal.     Breath sounds: Normal breath sounds.  Musculoskeletal:     Right lower leg: No edema.     Left lower leg: No edema.  Neurological:     Mental Status: He is alert.           Assessment & Plan:  Elevated BP. We discussed ways to manage his stress. He will work on exercise, and eating a healthy diet to lose some weight. He will be careful with his sodium intake. Recheck as needed.  Gershon Crane, MD

## 2023-05-14 ENCOUNTER — Encounter: Payer: Self-pay | Admitting: Family Medicine

## 2023-05-15 MED ORDER — LOSARTAN POTASSIUM 50 MG PO TABS: 50.0000 mg | ORAL_TABLET | Freq: Every day | ORAL | 3 refills | Status: DC

## 2023-05-15 NOTE — Telephone Encounter (Signed)
Agreed. Call in Losartan 50 mg daily, #30 with 5 rf

## 2023-05-29 ENCOUNTER — Encounter: Payer: Self-pay | Admitting: Family Medicine

## 2023-05-30 NOTE — Telephone Encounter (Signed)
Yes that would be fine

## 2023-08-18 ENCOUNTER — Encounter: Payer: Self-pay | Admitting: Family Medicine

## 2023-08-18 ENCOUNTER — Ambulatory Visit (INDEPENDENT_AMBULATORY_CARE_PROVIDER_SITE_OTHER): Payer: BC Managed Care – PPO | Admitting: Family Medicine

## 2023-08-18 VITALS — BP 108/78 | HR 80 | Temp 98.4°F | Ht 65.0 in | Wt 248.0 lb

## 2023-08-18 DIAGNOSIS — I10 Essential (primary) hypertension: Secondary | ICD-10-CM | POA: Diagnosis not present

## 2023-08-18 DIAGNOSIS — Z0000 Encounter for general adult medical examination without abnormal findings: Secondary | ICD-10-CM | POA: Diagnosis not present

## 2023-08-18 DIAGNOSIS — Z23 Encounter for immunization: Secondary | ICD-10-CM

## 2023-08-18 LAB — LIPID PANEL
Cholesterol: 128 mg/dL (ref 0–200)
HDL: 33.5 mg/dL — ABNORMAL LOW (ref >39.00)
LDL Cholesterol: 65 mg/dL (ref 0–99)
NonHDL: 94.68
Total CHOL/HDL Ratio: 4
Triglycerides: 147 mg/dL (ref 0.0–149.0)
VLDL: 29.4 mg/dL (ref 0.0–40.0)

## 2023-08-18 LAB — CBC WITH DIFFERENTIAL/PLATELET
Basophils Absolute: 0.1 10*3/uL (ref 0.0–0.1)
Basophils Relative: 0.6 % (ref 0.0–3.0)
Eosinophils Absolute: 0.4 10*3/uL (ref 0.0–0.7)
Eosinophils Relative: 3.4 % (ref 0.0–5.0)
HCT: 51.2 % (ref 39.0–52.0)
Hemoglobin: 17.4 g/dL — ABNORMAL HIGH (ref 13.0–17.0)
Lymphocytes Relative: 28.4 % (ref 12.0–46.0)
Lymphs Abs: 3.5 10*3/uL (ref 0.7–4.0)
MCHC: 34 g/dL (ref 30.0–36.0)
MCV: 89.2 fL (ref 78.0–100.0)
Monocytes Absolute: 0.8 10*3/uL (ref 0.1–1.0)
Monocytes Relative: 6.3 % (ref 3.0–12.0)
Neutro Abs: 7.6 10*3/uL (ref 1.4–7.7)
Neutrophils Relative %: 61.3 % (ref 43.0–77.0)
Platelets: 291 10*3/uL (ref 150.0–400.0)
RBC: 5.75 Mil/uL (ref 4.22–5.81)
RDW: 12.8 % (ref 11.5–15.5)
WBC: 12.4 10*3/uL — ABNORMAL HIGH (ref 4.0–10.5)

## 2023-08-18 LAB — BASIC METABOLIC PANEL
BUN: 13 mg/dL (ref 6–23)
CO2: 28 meq/L (ref 19–32)
Calcium: 9.5 mg/dL (ref 8.4–10.5)
Chloride: 102 meq/L (ref 96–112)
Creatinine, Ser: 1 mg/dL (ref 0.40–1.50)
GFR: 97.35 mL/min (ref >60.00)
Glucose, Bld: 88 mg/dL (ref 70–99)
Potassium: 4.3 meq/L (ref 3.5–5.1)
Sodium: 139 meq/L (ref 135–145)

## 2023-08-18 LAB — TSH: TSH: 2.06 u[IU]/mL (ref 0.35–5.50)

## 2023-08-18 LAB — HEPATIC FUNCTION PANEL
ALT: 44 U/L (ref 0–53)
AST: 27 U/L (ref 0–37)
Albumin: 4.7 g/dL (ref 3.5–5.2)
Alkaline Phosphatase: 67 U/L (ref 39–117)
Bilirubin, Direct: 0.2 mg/dL (ref 0.0–0.3)
Total Bilirubin: 0.7 mg/dL (ref 0.2–1.2)
Total Protein: 7 g/dL (ref 6.0–8.3)

## 2023-08-18 LAB — HEMOGLOBIN A1C: Hgb A1c MFr Bld: 5.2 % (ref 4.6–6.5)

## 2023-08-18 MED ORDER — LOSARTAN POTASSIUM 50 MG PO TABS: 50.0000 mg | ORAL_TABLET | Freq: Every day | ORAL | 3 refills | Status: DC

## 2023-08-18 NOTE — Progress Notes (Signed)
Subjective:    Patient ID: Wayne Patel, male    DOB: 08/12/1986, 37 y.o.   MRN: [id number]  HPI Here for a well exam. He feels fine. We recently started him on Losartan for HTN, and he has done well with this.    Review of Systems  Constitutional: Negative.   HENT: Negative.    Eyes: Negative.   Respiratory: Negative.    Cardiovascular: Negative.   Gastrointestinal: Negative.   Genitourinary: Negative.   Musculoskeletal: Negative.   Skin: Negative.   Neurological: Negative.   Psychiatric/Behavioral: Negative.         Objective:   Physical Exam Constitutional:      General: He is not in acute distress.    Appearance: He is well-developed. He is obese. He is not diaphoretic.  HENT:     Head: Normocephalic and atraumatic.     Right Ear: External ear normal.     Left Ear: External ear normal.     Nose: Nose normal.     Mouth/Throat:     Pharynx: No oropharyngeal exudate.  Eyes:     General: No scleral icterus.       Right eye: No discharge.        Left eye: No discharge.     Conjunctiva/sclera: Conjunctivae normal.     Pupils: Pupils are equal, round, and reactive to light.  Neck:     Thyroid: No thyromegaly.     Vascular: No JVD.     Trachea: No tracheal deviation.  Cardiovascular:     Rate and Rhythm: Normal rate and regular rhythm.     Pulses: Normal pulses.     Heart sounds: Normal heart sounds. No murmur heard.    No friction rub. No gallop.  Pulmonary:     Effort: Pulmonary effort is normal. No respiratory distress.     Breath sounds: Normal breath sounds. No wheezing or rales.  Chest:     Chest wall: No tenderness.  Abdominal:     General: Bowel sounds are normal. There is no distension.     Palpations: Abdomen is soft. There is no mass.     Tenderness: There is no abdominal tenderness. There is no guarding or rebound.  Genitourinary:    Penis: Normal. No tenderness.      Testes: Normal.  Musculoskeletal:        General: No tenderness. Normal  range of motion.     Cervical back: Neck supple.  Lymphadenopathy:     Cervical: No cervical adenopathy.  Skin:    General: Skin is warm and dry.     Coloration: Skin is not pale.     Findings: No erythema or rash.  Neurological:     General: No focal deficit present.     Mental Status: He is alert and oriented to person, place, and time.     Cranial Nerves: No cranial nerve deficit.     Motor: No abnormal muscle tone.     Coordination: Coordination normal.     Deep Tendon Reflexes: Reflexes are normal and symmetric. Reflexes normal.  Psychiatric:        Mood and Affect: Mood normal.        Behavior: Behavior normal.        Thought Content: Thought content normal.        Judgment: Judgment normal.           Assessment & Plan:  Well exam. We discussed diet and exercise. Get fasting labs.  Gershon Crane, MD

## 2023-08-18 NOTE — Addendum Note (Signed)
Addended by: Carola Rhine on: 08/18/2023 01:37 PM   Modules accepted: Orders

## 2024-01-17 IMAGING — US US ABDOMEN LIMITED
1 series · 14 of 25 positions shown · non-contrast
Comparison: None.

CLINICAL DATA: Elevated liver function tests with history of
gastroesophageal reflux disease x6 months.

EXAM:
ULTRASOUND ABDOMEN LIMITED RIGHT UPPER QUADRANT

[Series 1: us abdomen limited ruq (liver/gb) · 14 of 42 slices shown]
[im 1/42]
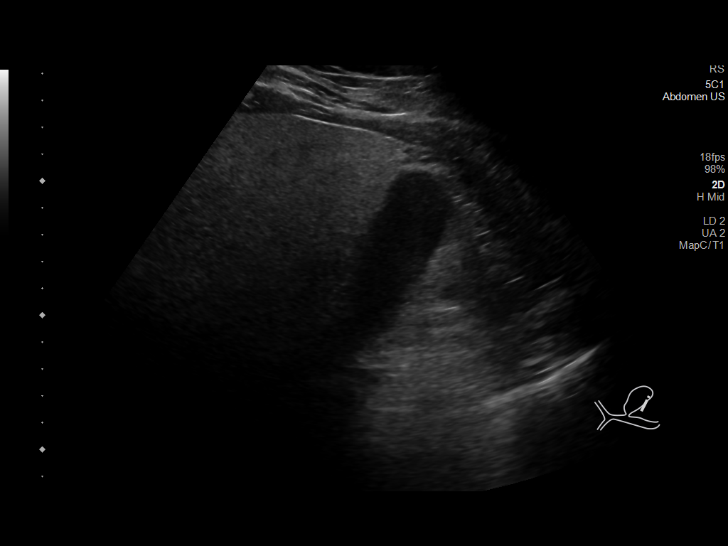
[im 4/42]
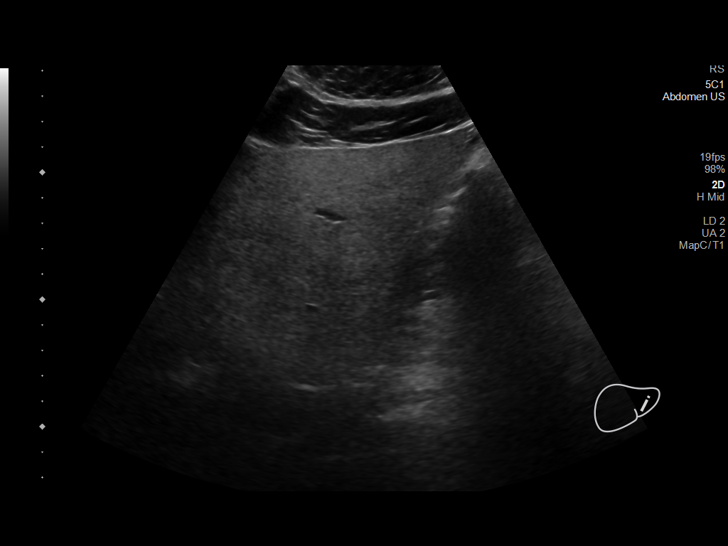
[im 7/42]
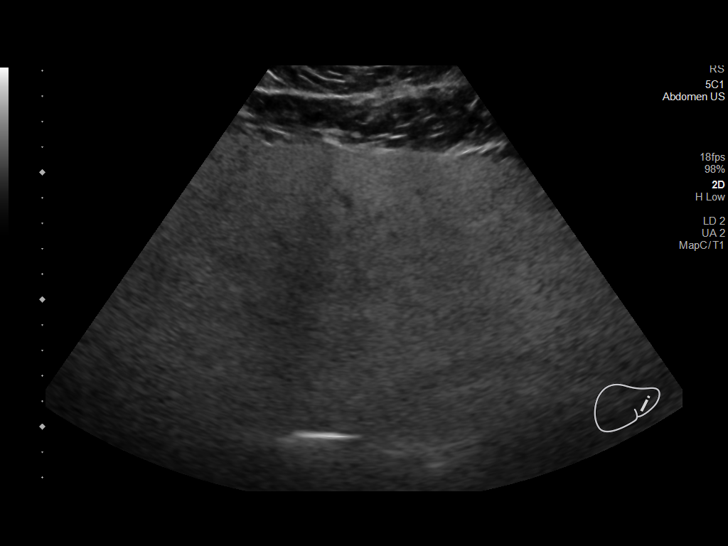
[im 11/42]
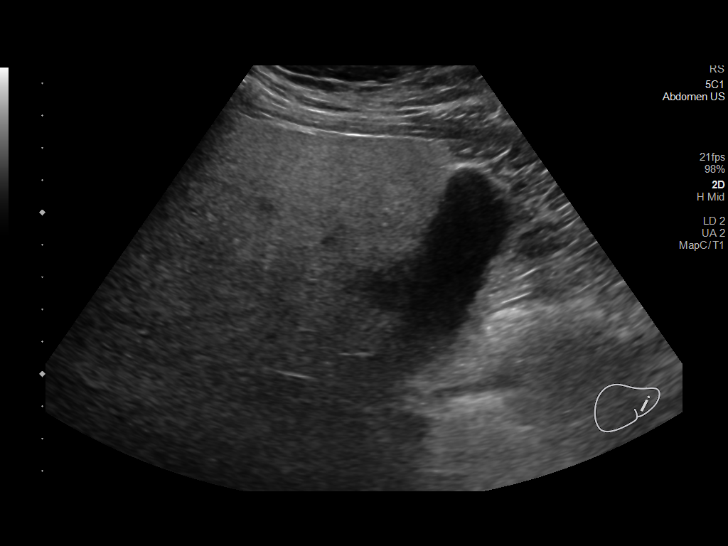
[im 14/42]
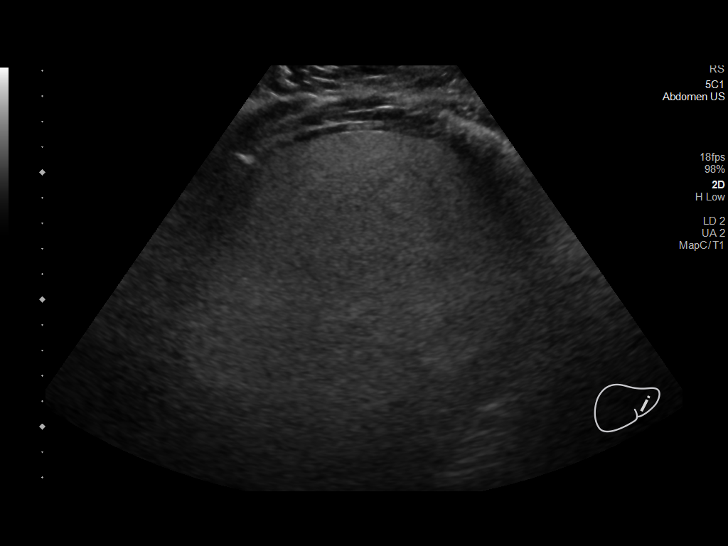
[im 16/42]
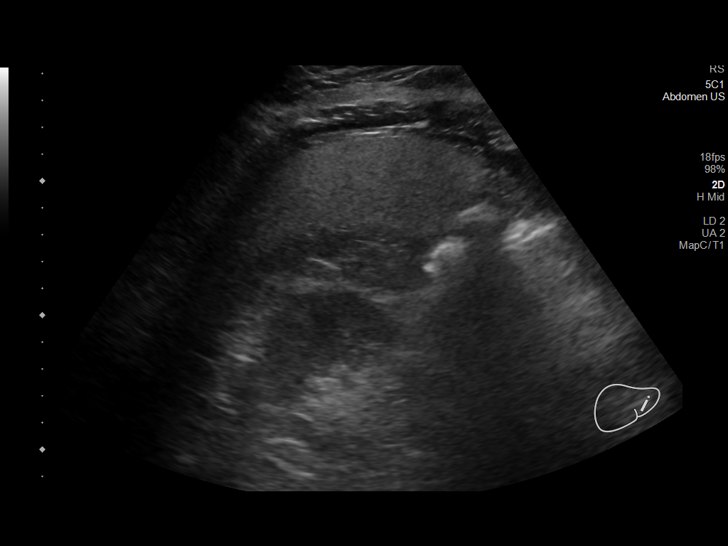
[im 19/42]
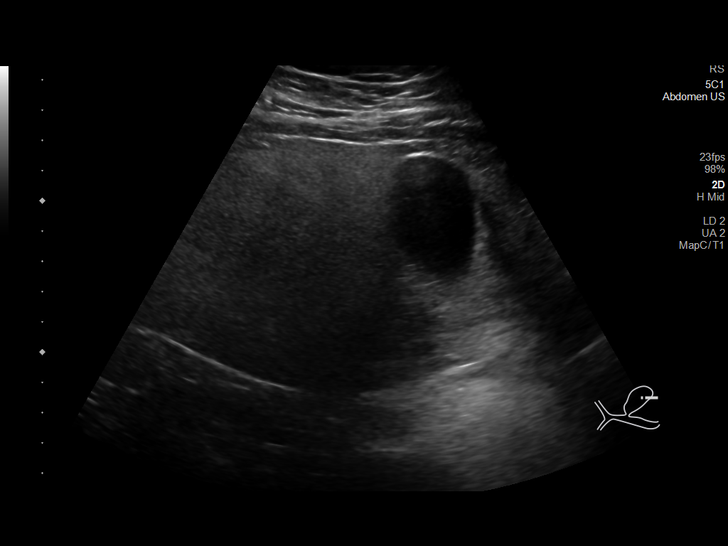
[im 23/42]
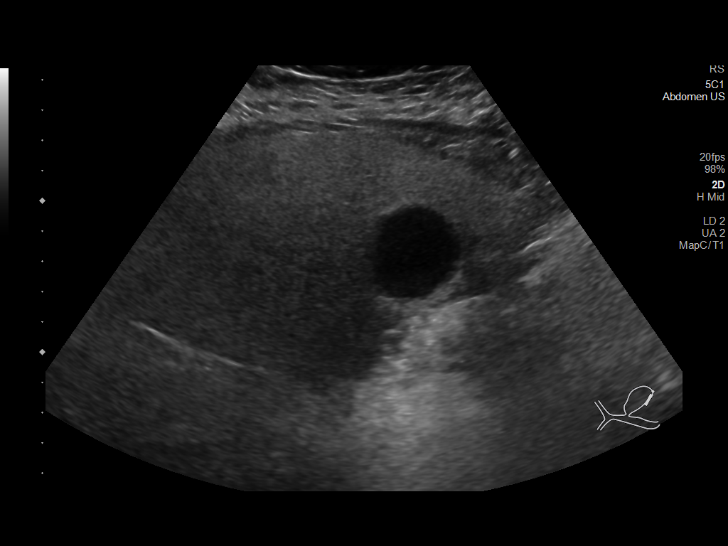
[im 26/42]
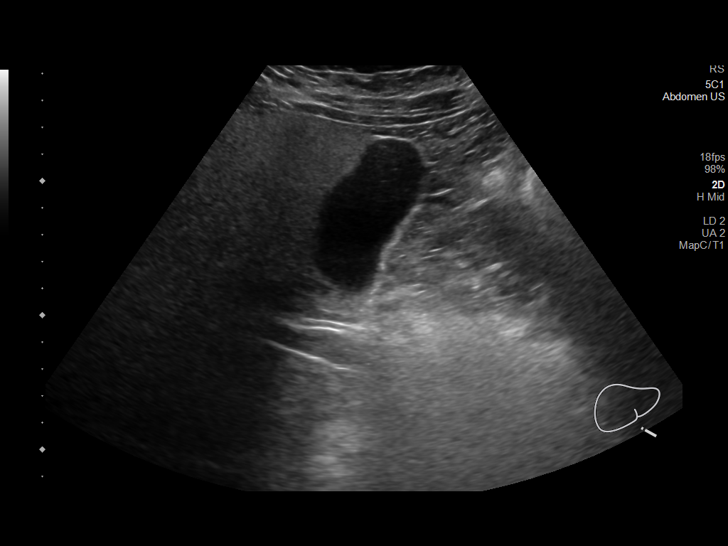
[im 28/42]
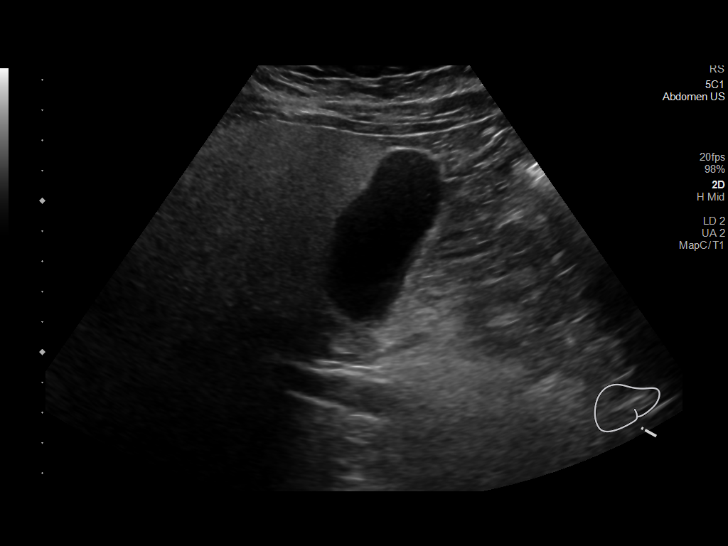
[im 31/42]
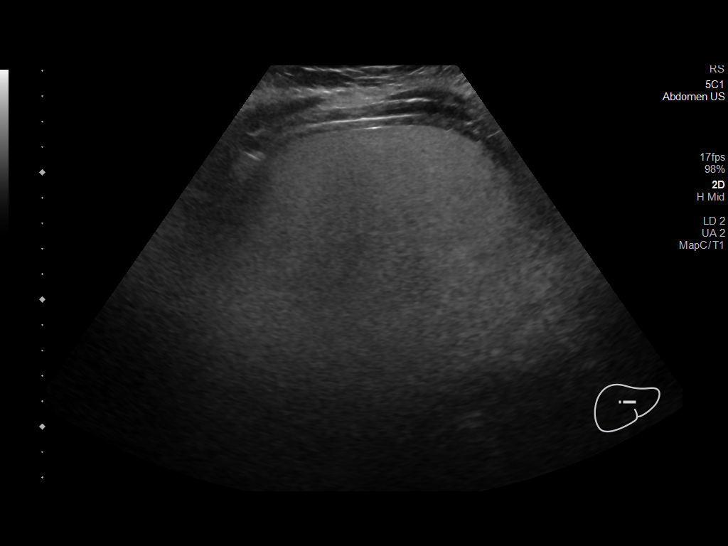
[im 35/42]
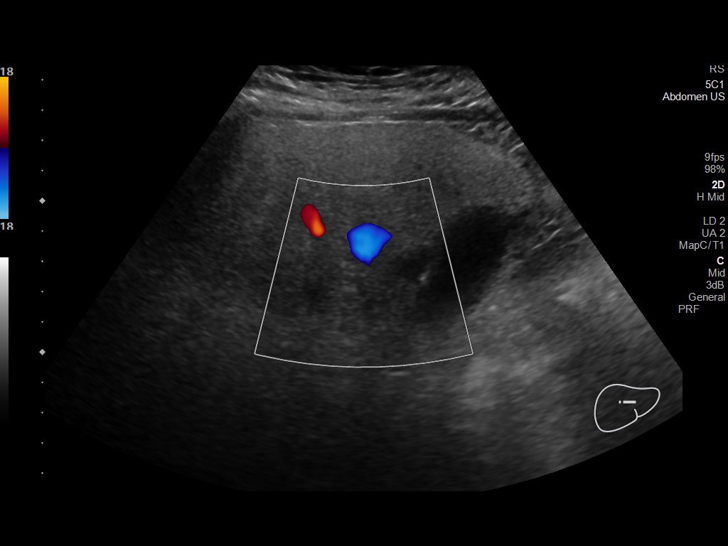
[im 38/42]
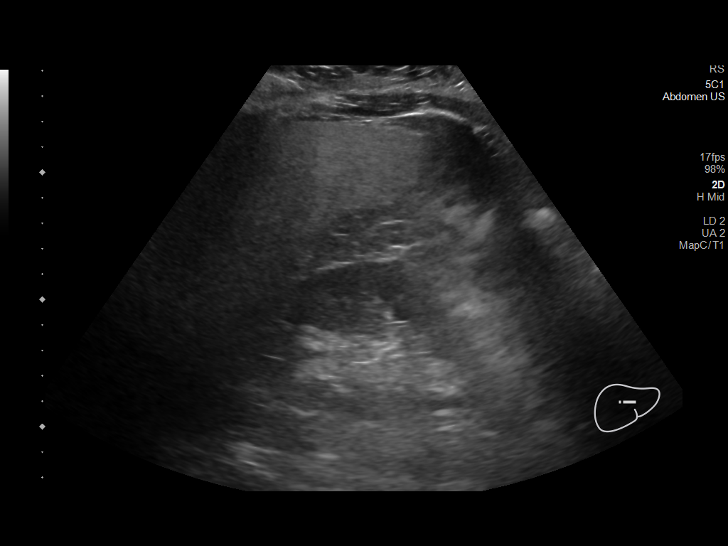
[im 42/42]
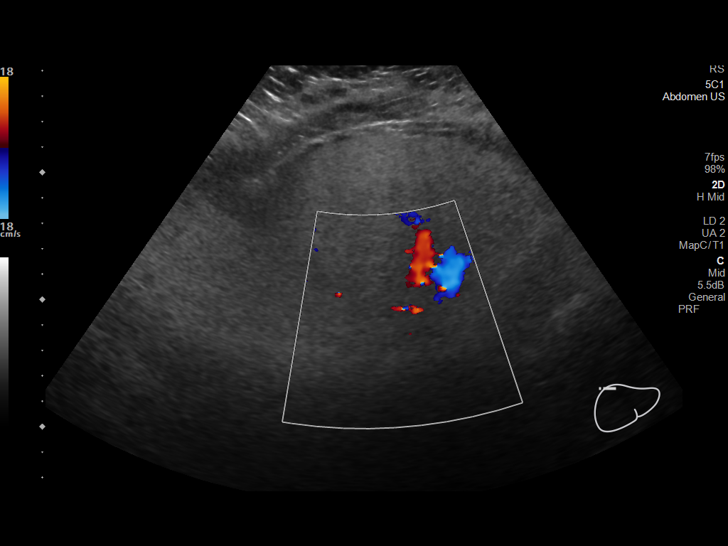

[14 of 25 positions shown; findings below may reference images not displayed]

FINDINGS: Gallbladder:

No gallstones or wall thickening visualized (2.6 mm). No sonographic
Murphy sign noted by sonographer.

Common bile duct:

Diameter: 2.8 mm

Liver:

A 1.4 cm x 1.2 cm x 1.5 cm hypoechoic area is seen within the right
lobe of the liver, adjacent to the gallbladder fossa. Diffusely
increased echogenicity of the liver parenchyma is seen. Portal vein
is patent on color Doppler imaging with normal direction of blood
flow towards the liver.

Other: The study is limited secondary to the patient's body habitus.
IMPRESSION: Hepatic steatosis with suspected focal fatty sparing.

## 2024-08-20 ENCOUNTER — Ambulatory Visit: Payer: Self-pay | Admitting: Family Medicine

## 2024-08-20 ENCOUNTER — Encounter: Payer: Self-pay | Admitting: Family Medicine

## 2024-08-20 ENCOUNTER — Ambulatory Visit: Admitting: Family Medicine

## 2024-08-20 VITALS — BP 110/78 | HR 89 | Temp 98.4°F | Ht 65.0 in | Wt 253.0 lb

## 2024-08-20 DIAGNOSIS — Z0000 Encounter for general adult medical examination without abnormal findings: Secondary | ICD-10-CM | POA: Diagnosis not present

## 2024-08-20 DIAGNOSIS — Z23 Encounter for immunization: Secondary | ICD-10-CM

## 2024-08-20 LAB — HEPATIC FUNCTION PANEL
ALT: 40 U/L (ref 3–53)
AST: 25 U/L (ref 5–37)
Albumin: 4.7 g/dL (ref 3.5–5.2)
Alkaline Phosphatase: 65 U/L (ref 39–117)
Bilirubin, Direct: 0.1 mg/dL (ref 0.1–0.3)
Total Bilirubin: 0.6 mg/dL (ref 0.2–1.2)
Total Protein: 7.4 g/dL (ref 6.0–8.3)

## 2024-08-20 LAB — CBC WITH DIFFERENTIAL/PLATELET
Basophils Absolute: 0.1 K/uL (ref 0.0–0.1)
Basophils Relative: 0.6 % (ref 0.0–3.0)
Eosinophils Absolute: 0.5 K/uL (ref 0.0–0.7)
Eosinophils Relative: 4.8 % (ref 0.0–5.0)
HCT: 51.4 % (ref 39.0–52.0)
Hemoglobin: 17.5 g/dL — ABNORMAL HIGH (ref 13.0–17.0)
Lymphocytes Relative: 27.2 % (ref 12.0–46.0)
Lymphs Abs: 2.8 K/uL (ref 0.7–4.0)
MCHC: 34.1 g/dL (ref 30.0–36.0)
MCV: 87.7 fl (ref 78.0–100.0)
Monocytes Absolute: 0.7 K/uL (ref 0.1–1.0)
Monocytes Relative: 6.4 % (ref 3.0–12.0)
Neutro Abs: 6.3 K/uL (ref 1.4–7.7)
Neutrophils Relative %: 61 % (ref 43.0–77.0)
Platelets: 285 K/uL (ref 150.0–400.0)
RBC: 5.86 Mil/uL — ABNORMAL HIGH (ref 4.22–5.81)
RDW: 13.1 % (ref 11.5–15.5)
WBC: 10.3 K/uL (ref 4.0–10.5)

## 2024-08-20 LAB — LIPID PANEL
Cholesterol: 129 mg/dL (ref 28–200)
HDL: 33.3 mg/dL — ABNORMAL LOW
LDL Cholesterol: 78 mg/dL (ref 10–99)
NonHDL: 96.02
Total CHOL/HDL Ratio: 4
Triglycerides: 92 mg/dL (ref 10.0–149.0)
VLDL: 18.4 mg/dL (ref 0.0–40.0)

## 2024-08-20 LAB — BASIC METABOLIC PANEL WITH GFR
BUN: 16 mg/dL (ref 6–23)
CO2: 29 meq/L (ref 19–32)
Calcium: 9.6 mg/dL (ref 8.4–10.5)
Chloride: 102 meq/L (ref 96–112)
Creatinine, Ser: 1.05 mg/dL (ref 0.40–1.50)
GFR: 91.17 mL/min
Glucose, Bld: 93 mg/dL (ref 70–99)
Potassium: 4.5 meq/L (ref 3.5–5.1)
Sodium: 139 meq/L (ref 135–145)

## 2024-08-20 LAB — TSH: TSH: 1.92 u[IU]/mL (ref 0.35–5.50)

## 2024-08-20 LAB — HEMOGLOBIN A1C: Hgb A1c MFr Bld: 5 % (ref 4.6–6.5)

## 2024-08-20 MED ORDER — LOSARTAN POTASSIUM 50 MG PO TABS: 50.0000 mg | ORAL_TABLET | Freq: Every day | ORAL | 3 refills | Status: AC

## 2024-08-20 NOTE — Addendum Note (Signed)
 Addended by: LADONNA INOCENTE SAILOR on: 08/20/2024 11:55 AM   Modules accepted: Orders

## 2024-08-20 NOTE — Progress Notes (Signed)
" ° °  Subjective:    Patient ID: Wayne Patel, male    DOB: 02/09/1986, 38 y.o.   MRN: [id number]  HPI Here for a well exam. He feels fine. His BP has been stable.    Review of Systems  Constitutional: Negative.   HENT: Negative.    Eyes: Negative.   Respiratory: Negative.    Cardiovascular: Negative.   Gastrointestinal: Negative.   Genitourinary: Negative.   Musculoskeletal: Negative.   Skin: Negative.   Neurological: Negative.   Psychiatric/Behavioral: Negative.         Objective:   Physical Exam Constitutional:      General: He is not in acute distress.    Appearance: He is well-developed. He is obese. He is not diaphoretic.  HENT:     Head: Normocephalic and atraumatic.     Right Ear: External ear normal.     Left Ear: External ear normal.     Nose: Nose normal.     Mouth/Throat:     Pharynx: No oropharyngeal exudate.  Eyes:     General: No scleral icterus.       Right eye: No discharge.        Left eye: No discharge.     Conjunctiva/sclera: Conjunctivae normal.     Pupils: Pupils are equal, round, and reactive to light.  Neck:     Thyroid : No thyromegaly.     Vascular: No JVD.     Trachea: No tracheal deviation.  Cardiovascular:     Rate and Rhythm: Normal rate and regular rhythm.     Pulses: Normal pulses.     Heart sounds: Normal heart sounds. No murmur heard.    No friction rub. No gallop.  Pulmonary:     Effort: Pulmonary effort is normal. No respiratory distress.     Breath sounds: Normal breath sounds. No wheezing or rales.  Chest:     Chest wall: No tenderness.  Abdominal:     General: Bowel sounds are normal. There is no distension.     Palpations: Abdomen is soft. There is no mass.     Tenderness: There is no abdominal tenderness. There is no guarding or rebound.  Genitourinary:    Penis: Normal. No tenderness.      Testes: Normal.  Musculoskeletal:        General: No tenderness. Normal range of motion.     Cervical back: Neck supple.   Lymphadenopathy:     Cervical: No cervical adenopathy.  Skin:    General: Skin is warm and dry.     Coloration: Skin is not pale.     Findings: No erythema or rash.  Neurological:     General: No focal deficit present.     Mental Status: He is alert and oriented to person, place, and time.     Cranial Nerves: No cranial nerve deficit.     Motor: No abnormal muscle tone.     Coordination: Coordination normal.     Deep Tendon Reflexes: Reflexes are normal and symmetric. Reflexes normal.  Psychiatric:        Mood and Affect: Mood normal.        Behavior: Behavior normal.        Thought Content: Thought content normal.        Judgment: Judgment normal.           Assessment & Plan:  Well exam. We discussed diet and exercise. Get fasting labs. Garnette Olmsted, MD  "

## 2024-08-30 ENCOUNTER — Other Ambulatory Visit: Payer: Self-pay | Admitting: Family Medicine
